# Patient Record
Sex: Male | Born: 1935 | Race: White | Hispanic: No | Marital: Married | Smoking: Former smoker
Health system: Southern US, Community
[De-identification: ages and names within clinical notes are randomized; demographics above are authoritative.]

## PROBLEM LIST (undated history)

## (undated) DIAGNOSIS — K2980 Duodenitis without bleeding: Secondary | ICD-10-CM

## (undated) DIAGNOSIS — K635 Polyp of colon: Secondary | ICD-10-CM

## (undated) DIAGNOSIS — N200 Calculus of kidney: Secondary | ICD-10-CM

## (undated) DIAGNOSIS — M1990 Unspecified osteoarthritis, unspecified site: Secondary | ICD-10-CM

## (undated) DIAGNOSIS — I2510 Atherosclerotic heart disease of native coronary artery without angina pectoris: Secondary | ICD-10-CM

## (undated) DIAGNOSIS — K219 Gastro-esophageal reflux disease without esophagitis: Secondary | ICD-10-CM

## (undated) DIAGNOSIS — N189 Chronic kidney disease, unspecified: Secondary | ICD-10-CM

## (undated) DIAGNOSIS — E785 Hyperlipidemia, unspecified: Secondary | ICD-10-CM

## (undated) DIAGNOSIS — G250 Essential tremor: Secondary | ICD-10-CM

## (undated) DIAGNOSIS — N529 Male erectile dysfunction, unspecified: Secondary | ICD-10-CM

## (undated) DIAGNOSIS — N400 Enlarged prostate without lower urinary tract symptoms: Secondary | ICD-10-CM

## (undated) DIAGNOSIS — I6529 Occlusion and stenosis of unspecified carotid artery: Secondary | ICD-10-CM

## (undated) DIAGNOSIS — M6530 Trigger finger, unspecified finger: Secondary | ICD-10-CM

## (undated) HISTORY — DX: Calculus of kidney: N20.0

## (undated) HISTORY — PX: COLONOSCOPY: SHX174

## (undated) HISTORY — PX: TRIGGER FINGER RELEASE: SHX641

---

## 1996-09-15 HISTORY — PX: CARDIAC CATHETERIZATION: SHX172

## 2006-10-22 ENCOUNTER — Ambulatory Visit: Payer: Self-pay | Admitting: Gastroenterology

## 2009-03-22 ENCOUNTER — Emergency Department: Payer: Self-pay | Admitting: Emergency Medicine

## 2010-01-31 ENCOUNTER — Ambulatory Visit: Payer: Self-pay | Admitting: Gastroenterology

## 2010-08-02 ENCOUNTER — Ambulatory Visit: Payer: Self-pay | Admitting: Anesthesiology

## 2010-08-05 ENCOUNTER — Ambulatory Visit: Payer: Self-pay | Admitting: General Practice

## 2012-06-28 ENCOUNTER — Ambulatory Visit: Payer: Self-pay | Admitting: Anesthesiology

## 2012-07-02 ENCOUNTER — Ambulatory Visit: Payer: Self-pay | Admitting: General Practice

## 2014-07-21 DIAGNOSIS — N183 Chronic kidney disease, stage 3 unspecified: Secondary | ICD-10-CM | POA: Insufficient documentation

## 2014-07-21 DIAGNOSIS — I129 Hypertensive chronic kidney disease with stage 1 through stage 4 chronic kidney disease, or unspecified chronic kidney disease: Secondary | ICD-10-CM | POA: Insufficient documentation

## 2014-09-09 ENCOUNTER — Emergency Department: Payer: Self-pay | Admitting: Emergency Medicine

## 2015-01-02 NOTE — Op Note (Signed)
PATIENT NAME:  Christopher Melton, OZBURN MR#:  [id number] DATE OF BIRTH:  [birth year]  DATE OF PROCEDURE:  07/02/2012  PREOPERATIVE DIAGNOSIS: Stenosing tenosynovitis of the right index, long, and ring fingers.   POSTOPERATIVE DIAGNOSIS:  Stenosing tenosynovitis of the right index, long, and ring fingers.   PROCEDURE PERFORMED: Trigger finger release of the right index, long, and ring fingers.   SURGEON: Laurice Record. Holley Bouche., MD   ANESTHESIA: General.   ESTIMATED BLOOD LOSS: Minimal.   FLUIDS REPLACED: 1800 mL of crystalloid.   TOURNIQUET TIME: 40 minutes.   DRAINS: None.   INDICATIONS FOR SURGERY:  The patient is a 81 year old right-hand dominant male who has been seen for complaints of catching and triggering of the right index, long, and ring fingers. He had associated tenderness in the area of the A1 pulley of the designated fingers. After discussion of the risks and benefits of surgical intervention, the patient expressed his understanding of the risks, benefits, and agreed with plans for surgical intervention.   PROCEDURE IN DETAIL: The patient was brought into the Operating Room, and after adequate general anesthesia was achieved a tourniquet was placed on the patient's upper right arm. The patient's right hand and arm were cleaned and prepped with alcohol and DuraPrep and draped in the usual sterile fashion. A "timeout" was performed as per usual protocol. Loupe magnification was used throughout the procedure. The right upper extremity was exsanguinated using an Esmarch, and the tourniquet was inflated to 250 mmHg. A transverse incision was made along the proximal margin of the A1 pulley of the right ring finger. Blunt dissection was carried down to the tendon sheath, and the proximal edge of the A1 pulley was identified. The A1 pulley was then incised using tenotomy scissors. Complete release of the A1 pulley of the ring finger was performed. Smooth excursion of the flexor tendon was noted.  Next, a transverse incision was made along the proximal margin of the A1 pulley of the right long finger. Blunt dissection was carried down to the tendon sheath, and the proximal edge of the A1 pulley was identified and subsequently released using tenotomy scissors. Again, smooth excursion of the flexor tendon was noted. Finally, a transverse incision was made at the proximal margin of the A1 pulley of the index finger. Blunt dissection was carried down to the tendon sheath, and the proximal margin of the A1 pulley index finger was identified and subsequently released using tenotomy scissors. Smooth excursion of the flexor tendon was noted.   The wounds were irrigated with copious amounts of normal saline with antibiotic solution. Skin edges were reapproximated with interrupted sutures of 5-0 nylon, and 0.25% Marcaine was injected along the incision sites. A sterile dressing was applied. The tourniquet was deflated after a total tourniquet time of 40 minutes.   The patient tolerated the procedure well. He was transported to the recovery room in stable condition.  ____________________________ Laurice Record. Holley Bouche., MD jph:cbb D: 07/03/2012 14:41:01 ET T: 07/03/2012 15:18:44 ET JOB#: [id number]  cc: Jeneen Rinks P. Holley Bouche., MD, <Dictator> JAMES P Holley Bouche MD ELECTRONICALLY SIGNED 07/04/2012 9:02

## 2015-01-19 ENCOUNTER — Other Ambulatory Visit: Payer: Self-pay | Admitting: Internal Medicine

## 2015-01-19 DIAGNOSIS — R1310 Dysphagia, unspecified: Secondary | ICD-10-CM

## 2015-01-22 ENCOUNTER — Ambulatory Visit: Payer: Self-pay

## 2015-01-24 ENCOUNTER — Ambulatory Visit
Admission: RE | Admit: 2015-01-24 | Discharge: 2015-01-24 | Disposition: A | Discharge status: Home / Self Care | Payer: Medicare Other | Source: Ambulatory Visit | Attending: Internal Medicine | Admitting: Internal Medicine

## 2015-01-24 DIAGNOSIS — K2210 Ulcer of esophagus without bleeding: Secondary | ICD-10-CM | POA: Insufficient documentation

## 2015-01-24 DIAGNOSIS — R1310 Dysphagia, unspecified: Secondary | ICD-10-CM

## 2015-01-24 DIAGNOSIS — K209 Esophagitis, unspecified: Secondary | ICD-10-CM | POA: Insufficient documentation

## 2015-01-24 DIAGNOSIS — K449 Diaphragmatic hernia without obstruction or gangrene: Secondary | ICD-10-CM | POA: Insufficient documentation

## 2015-06-18 ENCOUNTER — Encounter: Payer: Self-pay | Admitting: *Deleted

## 2015-06-19 ENCOUNTER — Ambulatory Visit: Payer: Medicare Other | Admitting: Anesthesiology

## 2015-06-19 ENCOUNTER — Encounter: Payer: Self-pay | Admitting: Anesthesiology

## 2015-06-19 ENCOUNTER — Encounter
Admission: RE | Disposition: A | Discharge status: Home / Self Care | Payer: Self-pay | Source: Ambulatory Visit | Attending: Gastroenterology

## 2015-06-19 ENCOUNTER — Ambulatory Visit
Admission: RE | Admit: 2015-06-19 | Discharge: 2015-06-19 | Disposition: A | Discharge status: Home / Self Care | Payer: Medicare Other | Source: Ambulatory Visit | Attending: Gastroenterology | Admitting: Gastroenterology

## 2015-06-19 DIAGNOSIS — Z820 Family history of epilepsy and other diseases of the nervous system: Secondary | ICD-10-CM | POA: Insufficient documentation

## 2015-06-19 DIAGNOSIS — M1990 Unspecified osteoarthritis, unspecified site: Secondary | ICD-10-CM | POA: Insufficient documentation

## 2015-06-19 DIAGNOSIS — N400 Enlarged prostate without lower urinary tract symptoms: Secondary | ICD-10-CM | POA: Insufficient documentation

## 2015-06-19 DIAGNOSIS — K2210 Ulcer of esophagus without bleeding: Secondary | ICD-10-CM | POA: Diagnosis not present

## 2015-06-19 DIAGNOSIS — I6529 Occlusion and stenosis of unspecified carotid artery: Secondary | ICD-10-CM | POA: Insufficient documentation

## 2015-06-19 DIAGNOSIS — E785 Hyperlipidemia, unspecified: Secondary | ICD-10-CM | POA: Diagnosis not present

## 2015-06-19 DIAGNOSIS — Z79899 Other long term (current) drug therapy: Secondary | ICD-10-CM | POA: Diagnosis not present

## 2015-06-19 DIAGNOSIS — K2980 Duodenitis without bleeding: Secondary | ICD-10-CM | POA: Insufficient documentation

## 2015-06-19 DIAGNOSIS — Z833 Family history of diabetes mellitus: Secondary | ICD-10-CM | POA: Diagnosis not present

## 2015-06-19 DIAGNOSIS — K5730 Diverticulosis of large intestine without perforation or abscess without bleeding: Secondary | ICD-10-CM | POA: Diagnosis not present

## 2015-06-19 DIAGNOSIS — G250 Essential tremor: Secondary | ICD-10-CM | POA: Diagnosis not present

## 2015-06-19 DIAGNOSIS — Z87891 Personal history of nicotine dependence: Secondary | ICD-10-CM | POA: Insufficient documentation

## 2015-06-19 DIAGNOSIS — I129 Hypertensive chronic kidney disease with stage 1 through stage 4 chronic kidney disease, or unspecified chronic kidney disease: Secondary | ICD-10-CM | POA: Insufficient documentation

## 2015-06-19 DIAGNOSIS — K449 Diaphragmatic hernia without obstruction or gangrene: Secondary | ICD-10-CM | POA: Insufficient documentation

## 2015-06-19 DIAGNOSIS — Z888 Allergy status to other drugs, medicaments and biological substances status: Secondary | ICD-10-CM | POA: Diagnosis not present

## 2015-06-19 DIAGNOSIS — K2950 Unspecified chronic gastritis without bleeding: Secondary | ICD-10-CM | POA: Insufficient documentation

## 2015-06-19 DIAGNOSIS — Z7982 Long term (current) use of aspirin: Secondary | ICD-10-CM | POA: Insufficient documentation

## 2015-06-19 DIAGNOSIS — Z86010 Personal history of colonic polyps: Secondary | ICD-10-CM | POA: Diagnosis not present

## 2015-06-19 DIAGNOSIS — N183 Chronic kidney disease, stage 3 (moderate): Secondary | ICD-10-CM | POA: Insufficient documentation

## 2015-06-19 DIAGNOSIS — Z808 Family history of malignant neoplasm of other organs or systems: Secondary | ICD-10-CM | POA: Insufficient documentation

## 2015-06-19 DIAGNOSIS — R1310 Dysphagia, unspecified: Secondary | ICD-10-CM | POA: Insufficient documentation

## 2015-06-19 DIAGNOSIS — D127 Benign neoplasm of rectosigmoid junction: Secondary | ICD-10-CM | POA: Insufficient documentation

## 2015-06-19 HISTORY — DX: Polyp of colon: K63.5

## 2015-06-19 HISTORY — DX: Trigger finger, unspecified finger: M65.30

## 2015-06-19 HISTORY — DX: Male erectile dysfunction, unspecified: N52.9

## 2015-06-19 HISTORY — DX: Benign prostatic hyperplasia without lower urinary tract symptoms: N40.0

## 2015-06-19 HISTORY — DX: Hyperlipidemia, unspecified: E78.5

## 2015-06-19 HISTORY — PX: COLONOSCOPY WITH PROPOFOL: SHX5780

## 2015-06-19 HISTORY — PX: ESOPHAGOGASTRODUODENOSCOPY (EGD) WITH PROPOFOL: SHX5813

## 2015-06-19 HISTORY — DX: Occlusion and stenosis of unspecified carotid artery: I65.29

## 2015-06-19 HISTORY — DX: Essential tremor: G25.0

## 2015-06-19 HISTORY — DX: Unspecified osteoarthritis, unspecified site: M19.90

## 2015-06-19 SURGERY — COLONOSCOPY WITH PROPOFOL
Anesthesia: General

## 2015-06-19 MED ORDER — FENTANYL CITRATE (PF) 100 MCG/2ML IJ SOLN
INTRAMUSCULAR | Status: DC | PRN
Start: 2015-06-19 — End: 2015-06-19
  Administered 2015-06-19 (×4): 25 ug via INTRAVENOUS

## 2015-06-19 MED ORDER — PHENYLEPHRINE HCL 10 MG/ML IJ SOLN
INTRAMUSCULAR | Status: DC | PRN
  Administered 2015-06-19: 50 ug via INTRAVENOUS

## 2015-06-19 MED ORDER — MIDAZOLAM HCL 2 MG/2ML IJ SOLN
INTRAMUSCULAR | Status: DC | PRN
  Administered 2015-06-19 (×4): 0.5 mg via INTRAVENOUS

## 2015-06-19 MED ORDER — PROPOFOL 500 MG/50ML IV EMUL
INTRAVENOUS | Status: DC | PRN
  Administered 2015-06-19: 75 ug/kg/min via INTRAVENOUS

## 2015-06-19 MED ORDER — SODIUM CHLORIDE 0.9 % IV SOLN: INTRAVENOUS | Status: DC

## 2015-06-19 MED ORDER — SODIUM CHLORIDE 0.9 % IV SOLN
INTRAVENOUS | Status: DC
  Administered 2015-06-19: 09:00:00 via INTRAVENOUS

## 2015-06-19 NOTE — Anesthesia Preprocedure Evaluation (Signed)
Anesthesia Evaluation  Patient identified by MRN, date of birth, ID band Patient awake    Reviewed: Allergy & Precautions, H&P , NPO status , Patient's Chart, lab work & pertinent test results, reviewed documented beta blocker date and time   History of Anesthesia Complications Negative for: history of anesthetic complications  Airway Mallampati: IV  TM Distance: >3 FB Neck ROM: full    Dental no notable dental hx. (+) Caps, Teeth Intact   Pulmonary neg pulmonary ROS,    Pulmonary exam normal breath sounds clear to auscultation       Cardiovascular Exercise Tolerance: Good hypertension, On Medications (-) angina+ Peripheral Vascular Disease (Carotid stenosis)  (-) CAD, (-) Past MI, (-) Cardiac Stents and (-) CABG Normal cardiovascular exam(-) dysrhythmias (-) Valvular Problems/Murmurs Rhythm:regular Rate:Normal     Neuro/Psych negative neurological ROS  negative psych ROS   GI/Hepatic negative GI ROS, Neg liver ROS,   Endo/Other  negative endocrine ROS  Renal/GU negative Renal ROS  negative genitourinary   Musculoskeletal   Abdominal   Peds  Hematology negative hematology ROS (+)   Anesthesia Other Findings Past Medical History:   Hyperlipidemia                                               Erectile dysfunction                                         Osteoarthritis                                               Colon polyps                                                 Familial tremor                                              Carotid stenosis                                             Trigger finger                                               BPH (benign prostatic hyperplasia)                           Reproductive/Obstetrics negative OB ROS                             Anesthesia Physical Anesthesia Plan  ASA: II  Anesthesia Plan: General   Post-op Pain  Management:     Induction:   Airway Management Planned:   Additional Equipment:   Intra-op Plan:   Post-operative Plan:   Informed Consent: I have reviewed the patients History and Physical, chart, labs and discussed the procedure including the risks, benefits and alternatives for the proposed anesthesia with the patient or authorized representative who has indicated his/her understanding and acceptance.   Dental Advisory Given  Plan Discussed with: Anesthesiologist, CRNA and Surgeon  Anesthesia Plan Comments:         Anesthesia Quick Evaluation

## 2015-06-19 NOTE — Transfer of Care (Signed)
Immediate Anesthesia Transfer of Care Note  Patient: Christopher Melton  Procedure(s) Performed: Procedure(s): COLONOSCOPY WITH PROPOFOL (N/A) ESOPHAGOGASTRODUODENOSCOPY (EGD) WITH PROPOFOL (N/A)  Patient Location: PACU  Anesthesia Type:General  Level of Consciousness: sedated  Airway & Oxygen Therapy: Patient Spontanous Breathing and Patient connected to nasal cannula oxygen  Post-op Assessment: Report given to RN and Post -op Vital signs reviewed and unstable, Anesthesiologist notified  Post vital signs: Reviewed and stable  Last Vitals:  Filed Vitals:   06/19/15 0819  BP: 163/70  Pulse: 72  Temp: 35.8 C  Resp: 17     Complications: No apparent anesthesia complications

## 2015-06-19 NOTE — Op Note (Signed)
[facility] Gastroenterology Patient Name: Christopher Melton Procedure Date: 06/19/2015 9:04 AM MRN: [id number] Account #: [id number] Date of Birth: Jul 28, 1934 Admit Type: Outpatient Age: 81 Room: [facility] ENDO ROOM 3 Gender: Male Note Status: Finalized Procedure:         Upper GI endoscopy Indications:       Dysphagia Providers:         Lollie Sails, MD Referring MD:      Ocie Cornfield. Ouida Sills, MD (Referring MD) Medicines:         Monitored Anesthesia Care Complications:     No immediate complications. Procedure:         Pre-Anesthesia Assessment:                    - ASA Grade Assessment: II - A patient with mild systemic                     disease.                    After obtaining informed consent, the endoscope was passed                     under direct vision. Throughout the procedure, the                     patient's blood pressure, pulse, and oxygen saturations                     were monitored continuously. The Endoscope was introduced                     through the mouth, and advanced to the third part of                     duodenum. The upper GI endoscopy was accomplished without                     difficulty. The patient tolerated the procedure well. Findings:      Abnormal motility was noted in the middle third of the esophagus and in       the lower third of the esophagus. The cricopharyngeus was normal. There       are extra peristaltic waves of the esophageal body. The distal       esophagus/lower esophageal sphincter is open. Tertiary peristaltic waves       are noted.      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 normal esophageal caliber. no mucosal defect seen      LA Grade B (one or more mucosal breaks greater than 5 mm, not extending       between the tops of two mucosal folds) esophagitis with no bleeding was       found. Biopsies were taken with a cold forceps for histology.      A small  hiatus hernia was present.      The entire examined stomach was normal.      The cardia and gastric fundus were normal on retroflexion.      Patchy mild inflammation characterized by congestion (edema) and       erythema was found in the duodenal bulb and in the second part of the       duodenum.      Localized nodular mucosa was found  in the anterior duodenal bulb.       Biopsies were taken with a cold forceps for histology.      The exam of the duodenum was otherwise normal. 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                    - LA Grade B erosive esophagitis. Biopsied.                    - Small hiatus hernia.                    - Normal stomach.                    - Erosive duodenitis.                    - Nodular mucosa in the duodenal bulb. Biopsied. Recommendation:    - Use Aciphex (rabeprazole) 20 mg PO daily daily.                    - Return to GI clinic in 1 month. Procedure Code(s): --- Professional ---                    [id number], Esophagogastroduodenoscopy, flexible, transoral;                     with biopsy, single or multiple Diagnosis Code(s): --- Professional ---                    530.19, Other esophagitis                    535.60, Duodenitis, without mention of hemorrhage                    537.9, Unspecified disorder of stomach and duodenum                    787.20, Dysphagia, unspecified                    553.3, Diaphragmatic hernia without mention of obstruction                     or gangrene CPT copyright 2014 American Medical Association. All rights reserved. The codes documented in this report are preliminary and upon coder review may  be revised to meet current compliance requirements. Lollie Sails, MD 06/19/2015 9:30:43 AM This report has been signed electronically. Number of Addenda: 0 Note Initiated On: 06/19/2015 9:04 AM      [facility]

## 2015-06-19 NOTE — H&P (Signed)
Outpatient short stay form Pre-procedure 06/19/2015 8:58 AM Christopher Sails MD  Primary Physician: Dr. Frazier Richards  Reason for visit:  EGD and colonoscopy  History of present illness:  Patient is a 81 year old male presenting for EGD and colonoscopy. He is been having some difficulty with cervical dysphagia. On a barium swallow there is a possibility of ulceration or other abnormality noted at the cervicothoracic junction without a associated mass. There is also a small pseudodiverticulum at that spot. Believe this is likely causing a slight catch there. He also was  shown to have fairly extensive esophageal dysmotility. He had been taking medications immediately before going to bed.    Current facility-administered medications:  .  0.9 %  sodium chloride infusion, , Intravenous, Continuous, Christopher Sails, MD, Last Rate: 20 mL/hr at 06/19/15 0834 .  0.9 %  sodium chloride infusion, , Intravenous, Continuous, Christopher Sails, MD  Prescriptions prior to admission  Medication Sig Dispense Refill Last Dose  . aspirin 81 MG tablet Take 81 mg by mouth daily.   06/17/2015 at 0930  . losartan-hydrochlorothiazide (HYZAAR) 100-12.5 MG tablet Take 1 tablet by mouth daily.   06/18/2015 at 0900     Allergies  Allergen Reactions  . Ace Inhibitors   . Lovastatin      Past Medical History  Diagnosis Date  . Hyperlipidemia   . Erectile dysfunction   . Osteoarthritis   . Colon polyps   . Familial tremor   . Carotid stenosis   . Trigger finger   . BPH (benign prostatic hyperplasia)     Review of systems:      Physical Exam    Heart and lungs: Regular rate and rhythm without rub or gallop, lungs are bilaterally clear    HEENT: Normocephalic atraumatic eyes are anicteric    Other:     Pertinant exam for procedure: Soft and slightly protuberant nontender bowel sounds are positive normoactive    Planned proceedures: EGD and colonoscopy with indicated procedures I have  discussed the risks benefits and complications of procedures to include not limited to bleeding, infection, perforation and the risk of sedation and the patient wishes to proceed.  I did discuss with patient's and his wife that he needs to low down a bit when eating and alternate liquids and solids. Needs to chew well.    Christopher Sails, MD Gastroenterology 06/19/2015  8:58 AM

## 2015-06-19 NOTE — Op Note (Signed)
[facility] Gastroenterology Patient Name: Christopher Melton Procedure Date: 06/19/2015 9:04 AM MRN: [id number] Account #: [id number] Date of Birth: 04/22/1934 Admit Type: Outpatient Age: 81 Room: [facility] ENDO ROOM 3 Gender: Male Note Status: Finalized Procedure:         Colonoscopy Indications:       Personal history of colonic polyps Providers:         Lollie Sails, MD Referring MD:      Ocie Cornfield. Ouida Sills, MD (Referring MD) Medicines:         Monitored Anesthesia Care Complications:     No immediate complications. Procedure:         Pre-Anesthesia Assessment:                    - ASA Grade Assessment: II - A patient with mild systemic                     disease.                    After obtaining informed consent, the colonoscope was                     passed under direct vision. Throughout the procedure, the                     patient's blood pressure, pulse, and oxygen saturations                     were monitored continuously. The Colonoscope was                     introduced through the anus and advanced to the the cecum,                     identified by appendiceal orifice and ileocecal valve. The                     colonoscopy was performed with moderate difficulty due to                     a tortuous colon. Successful completion of the procedure                     was aided by changing the patient to a supine position and                     using manual pressure. The quality of the bowel                     preparation was good. Findings:      A 5 mm polyp was found in the recto-sigmoid colon. The polyp was       sessile. The polyp was removed with a cold biopsy forceps. Resection and       retrieval were complete.      A few small-mouthed diverticula were found in the sigmoid colon.      The retroflexed view of the distal rectum and anal verge was normal and       showed no anal or rectal abnormalities.      The digital rectal exam was  normal. Impression:        - One 5 mm polyp at the recto-sigmoid colon. Resected and  retrieved.                    - Diverticulosis in the sigmoid colon. Recommendation:    - Await pathology results.                    - Telephone GI clinic for pathology results in 1 week. Procedure Code(s): --- Professional ---                    [phone], Colonoscopy, flexible; with biopsy, single or                     multiple Diagnosis Code(s): --- Professional ---                    211.4, Benign neoplasm of rectum and anal canal                    V12.72, Personal history of colonic polyps                    562.10, Diverticulosis of colon (without mention of                     hemorrhage) CPT copyright 2014 American Medical Association. All rights reserved. The codes documented in this report are preliminary and upon coder review may  be revised to meet current compliance requirements. Lollie Sails, MD 06/19/2015 9:55:54 AM This report has been signed electronically. Number of Addenda: 0 Note Initiated On: 06/19/2015 9:04 AM Scope Withdrawal Time: 0 hours 5 minutes 43 seconds  Total Procedure Duration: 0 hours 19 minutes 17 seconds       [facility]

## 2015-06-20 LAB — SURGICAL PATHOLOGY

## 2015-06-21 NOTE — Anesthesia Postprocedure Evaluation (Signed)
  Anesthesia Post-op Note  Patient: Christopher Melton  Procedure(s) Performed: Procedure(s): COLONOSCOPY WITH PROPOFOL (N/A) ESOPHAGOGASTRODUODENOSCOPY (EGD) WITH PROPOFOL (N/A)  Anesthesia type:General  Patient location: PACU  Post pain: Pain level controlled  Post assessment: Post-op Vital signs reviewed, Patient's Cardiovascular Status Stable, Respiratory Function Stable, Patent Airway and No signs of Nausea or vomiting  Post vital signs: Reviewed and stable  Last Vitals:  Filed Vitals:   06/19/15 1040  BP: 136/69  Pulse: 62  Temp:   Resp: 16    Level of consciousness: awake, alert  and patient cooperative  Complications: No apparent anesthesia complications

## 2015-06-25 ENCOUNTER — Encounter: Payer: Self-pay | Admitting: Gastroenterology

## 2015-11-20 DIAGNOSIS — Z0000 Encounter for general adult medical examination without abnormal findings: Secondary | ICD-10-CM | POA: Insufficient documentation

## 2016-12-30 ENCOUNTER — Ambulatory Visit (INDEPENDENT_AMBULATORY_CARE_PROVIDER_SITE_OTHER): Payer: Medicare Other | Admitting: Vascular Surgery

## 2016-12-30 ENCOUNTER — Encounter (INDEPENDENT_AMBULATORY_CARE_PROVIDER_SITE_OTHER): Payer: Self-pay | Admitting: Vascular Surgery

## 2016-12-30 VITALS — BP 167/79 | HR 64 | Resp 16 | Ht 70.0 in | Wt 196.0 lb

## 2016-12-30 DIAGNOSIS — M79605 Pain in left leg: Secondary | ICD-10-CM | POA: Diagnosis not present

## 2016-12-30 DIAGNOSIS — I10 Essential (primary) hypertension: Secondary | ICD-10-CM

## 2016-12-30 DIAGNOSIS — M79609 Pain in unspecified limb: Secondary | ICD-10-CM | POA: Insufficient documentation

## 2016-12-30 NOTE — Assessment & Plan Note (Signed)
blood pressure control important in reducing the progression of atherosclerotic disease. On appropriate oral medications.  

## 2016-12-30 NOTE — Progress Notes (Signed)
MRN : [id number]  Christopher Melton is a 83 y.o. (1934-04-20) male who presents with chief complaint of  Chief Complaint  Patient presents with  . Re-evaluation    Left leg pain  .  History of Present Illness: Patient returns today in follow up of left leg pain.  He was previously evaluated over 2 years ago which time he was found to have a negative venous studies. His pain has been largely in the left calf and lower leg. This is not reliably associated with activity. There is intermittent swelling although he thinks his left leg has been getting smaller over the past several months and actually having atrophy. They look reasonably symmetric today. He does not have ulceration or infection. Nothing really seems to make this better other than time. He does have a history of low back problems as well as a previous Baker cyst on the left knee a couple of years ago. The right leg is not really that bothersome. He denies chest pain or shortness of breath.  Current Outpatient Prescriptions  Medication Sig Dispense Refill  . aspirin 81 MG tablet Take 81 mg by mouth daily.    Marland Kitchen losartan-hydrochlorothiazide (HYZAAR) 100-12.5 MG tablet Take 1 tablet by mouth daily.    . pantoprazole (PROTONIX) 40 MG tablet     . sildenafil (REVATIO) 20 MG tablet Take by mouth.     No current facility-administered medications for this visit.     Past Medical History:  Diagnosis Date  . BPH (benign prostatic hyperplasia)   . Carotid stenosis   . Colon polyps   . Erectile dysfunction   . Familial tremor   . Hyperlipidemia   . Osteoarthritis   . Trigger finger     Past Surgical History:  Procedure Laterality Date  . COLONOSCOPY    . COLONOSCOPY WITH PROPOFOL N/A 06/19/2015   Procedure: COLONOSCOPY WITH PROPOFOL;  Surgeon: Lollie Sails, MD;  Location: [facility] ENDOSCOPY;  Service: Endoscopy;  Laterality: N/A;  . ESOPHAGOGASTRODUODENOSCOPY (EGD) WITH PROPOFOL N/A 06/19/2015   Procedure:  ESOPHAGOGASTRODUODENOSCOPY (EGD) WITH PROPOFOL;  Surgeon: Lollie Sails, MD;  Location: [facility] ENDOSCOPY;  Service: Endoscopy;  Laterality: N/A;    Social History Social History  Substance Use Topics  . Smoking status: Former Smoker    Quit date: 12/30/1976  . Smokeless tobacco: Never Used  . Alcohol use No  No IVDU  Family History No bleeding disorders, clotting disorders, aneurysms, or autoimmune diseases  Allergies  Allergen Reactions  . Ace Inhibitors   . Lovastatin      REVIEW OF SYSTEMS (Negative unless checked)  Constitutional: [] Weight loss  [] Fever  [] Chills Cardiac: [] Chest pain   [] Chest pressure   [] Palpitations   [] Shortness of breath when laying flat   [] Shortness of breath at rest   [x] Shortness of breath with exertion. Vascular:  [x] Pain in legs with walking   [x] Pain in legs at rest   [] Pain in legs when laying flat   [] Claudication   [] Pain in feet when walking  [] Pain in feet at rest  [] Pain in feet when laying flat   [] History of DVT   [] Phlebitis   [x] Swelling in legs   [] Varicose veins   [] Non-healing ulcers Pulmonary:   [] Uses home oxygen   [] Productive cough   [] Hemoptysis   [] Wheeze  [] COPD   [] Asthma Neurologic:  [] Dizziness  [] Blackouts   [] Seizures   [] History of stroke   [] History of TIA  [] Aphasia   [] Temporary blindness   []   Dysphagia   [] Weakness or numbness in arms   [] Weakness or numbness in legs Musculoskeletal:  [x] Arthritis   [] Joint swelling   [] Joint pain   [x] Low back pain Hematologic:  [] Easy bruising  [] Easy bleeding   [] Hypercoagulable state   [] Anemic   Gastrointestinal:  [] Blood in stool   [] Vomiting blood  [] Gastroesophageal reflux/heartburn   [] Abdominal pain Genitourinary:  [] Chronic kidney disease   [] Difficult urination  [] Frequent urination  [] Burning with urination   [] Hematuria Skin:  [] Rashes   [] Ulcers   [] Wounds Psychological:  [] History of anxiety   []  History of major depression.  Physical Examination  BP (!) 167/79 (BP  Location: Right Arm)   Pulse 64   Resp 16   Ht 5\' 10"  (1.778 m)   Wt 196 lb (88.9 kg)   BMI 28.12 kg/m  Gen:  WD/WN, NAD Head: [place]/AT, No temporalis wasting. Ear/Nose/Throat: Hearing grossly intact, nares w/o erythema or drainage, trachea midline Eyes: Conjunctiva clear. Sclera non-icteric Neck: Supple.  No JVD.  Pulmonary:  Good air movement, no use of accessory muscles.  Cardiac: RRR, normal S1, S2 Vascular:  Vessel Right Left  Radial Palpable Palpable  Ulnar Palpable Palpable  Brachial Palpable Palpable  Carotid Palpable, without bruit Palpable, without bruit  Aorta Not palpable N/A  Femoral Palpable Palpable  Popliteal Palpable Palpable  PT 1+ Palpable 1+ Palpable  DP Palpable Palpable   Gastrointestinal: soft, non-tender/non-distended. No guarding/reflex.  Musculoskeletal: M/S 5/5 throughout.  No deformity or atrophy. Trace LE edema. Neurologic: Sensation grossly intact in extremities.  Symmetrical.  Speech is fluent.  Psychiatric: Judgment intact, Mood & affect appropriate for pt's clinical situation. Dermatologic: No rashes or ulcers noted.  No cellulitis or open wounds. Lymph : No Cervical, Axillary, or Inguinal lymphadenopathy.      Labs No results found for this or any previous visit (from the past 2160 hour(s)).  Radiology No results found.    Assessment/Plan  Essential hypertension blood pressure control important in reducing the progression of atherosclerotic disease. On appropriate oral medications.   Pain in limb  Recommend:  The patient has atypical pain symptoms for pure atherosclerotic disease. However, on physical exam there is evidence of mixed venous and arterial disease, given the diminished pulses and the edema associated with venous changes of the legs.  Noninvasive studies including ABI's and venous ultrasound of the legs will be obtained and the patient will follow up with me to review these studies.  The patient should continue  walking and begin a more formal exercise program. The patient should continue his antiplatelet therapy and aggressive treatment of the lipid abnormalities.  The patient should begin wearing graduated compression socks 15-20 mmHg strength to control edema.     Leotis Pain, MD  12/30/2016 12:41 PM    This note was created with Dragon medical transcription system.  Any errors from dictation are purely unintentional

## 2016-12-30 NOTE — Assessment & Plan Note (Signed)

## 2016-12-30 NOTE — Patient Instructions (Signed)
Peripheral Vascular Disease Peripheral vascular disease (PVD) is a disease of the blood vessels that are not part of your heart and brain. A simple term for PVD is poor circulation. In most cases, PVD narrows the blood vessels that carry blood from your heart to the rest of your body. This can result in a decreased supply of blood to your arms, legs, and internal organs, like your stomach or kidneys. However, it most often affects a person's lower legs and feet. There are two types of PVD.  Organic PVD. This is the more common type. It is caused by damage to the structure of blood vessels.  Functional PVD. This is caused by conditions that make blood vessels contract and tighten (spasm).  Without treatment, PVD tends to get worse over time. PVD can also lead to acute ischemic limb. This is when an arm or limb suddenly has trouble getting enough blood. This is a medical emergency. What are the causes? Each type of PVD has many different causes. The most common cause of PVD is buildup of a fatty material (plaque) inside of your arteries (atherosclerosis). Small amounts of plaque can break off from the walls of the blood vessels and become lodged in a smaller artery. This blocks blood flow and can cause acute ischemic limb. Other common causes of PVD include:  Blood clots that form inside of blood vessels.  Injuries to blood vessels.  Diseases that cause inflammation of blood vessels or cause blood vessel spasms.  Health behaviors and health history that increase your risk of developing PVD.  What increases the risk? You may have a greater risk of PVD if you:  Have a family history of PVD.  Have certain medical conditions, including: ? High cholesterol. ? Diabetes. ? High blood pressure (hypertension). ? Coronary heart disease. ? Past problems with blood clots. ? Past injury, such as burns or a broken bone. These may have damaged blood vessels in your limbs. ? Buerger disease. This is  caused by inflamed blood vessels in your hands and feet. ? Some forms of arthritis. ? Rare birth defects that affect the arteries in your legs.  Use tobacco.  Do not get enough exercise.  Are obese.  Are age 50 or older.  What are the signs or symptoms? PVD may cause many different symptoms. Your symptoms depend on what part of your body is not getting enough blood. Some common signs and symptoms include:  Cramps in your lower legs. This may be a symptom of poor leg circulation (claudication).  Pain and weakness in your legs while you are physically active that goes away when you rest (intermittent claudication).  Leg pain when at rest.  Leg numbness, tingling, or weakness.  Coldness in a leg or foot, especially when compared with the other leg.  Skin or hair changes. These can include: ? Hair loss. ? Shiny skin. ? Pale or bluish skin. ? Thick toenails.  Inability to get or maintain an erection (erectile dysfunction).  People with PVD are more prone to developing ulcers and sores on their toes, feet, or legs. These may take longer than normal to heal. How is this diagnosed? Your health care provider may diagnose PVD from your signs and symptoms. The health care provider will also do a physical exam. You may have tests to find out what is causing your PVD and determine its severity. Tests may include:  Blood pressure recordings from your arms and legs and measurements of the strength of your pulses (  pulse volume recordings).  Imaging studies using sound waves to take pictures of the blood flow through your blood vessels (Doppler ultrasound).  Injecting a dye into your blood vessels before having imaging studies using: ? X-rays (angiogram or arteriogram). ? Computer-generated X-rays (CT angiogram). ? A powerful electromagnetic field and a computer (magnetic resonance angiogram or MRA).  How is this treated? Treatment for PVD depends on the cause of your condition and the  severity of your symptoms. It also depends on your age. Underlying causes need to be treated and controlled. These include long-lasting (chronic) conditions, such as diabetes, high cholesterol, and high blood pressure. You may need to first try making lifestyle changes and taking medicines. Surgery may be needed if these do not work. Lifestyle changes may include:  Quitting smoking.  Exercising regularly.  Following a low-fat, low-cholesterol diet.  Medicines may include:  Blood thinners to prevent blood clots.  Medicines to improve blood flow.  Medicines to improve your blood cholesterol levels.  Surgical procedures may include:  A procedure that uses an inflated balloon to open a blocked artery and improve blood flow (angioplasty).  A procedure to put in a tube (stent) to keep a blocked artery open (stent implant).  Surgery to reroute blood flow around a blocked artery (peripheral bypass surgery).  Surgery to remove dead tissue from an infected wound on the affected limb.  Amputation. This is surgical removal of the affected limb. This may be necessary in cases of acute ischemic limb that are not improved through medical or surgical treatments.  Follow these instructions at home:  Take medicines only as directed by your health care provider.  Do not use any tobacco products, including cigarettes, chewing tobacco, or electronic cigarettes. If you need help quitting, ask your health care provider.  Lose weight if you are overweight, and maintain a healthy weight as directed by your health care provider.  Eat a diet that is low in fat and cholesterol. If you need help, ask your health care provider.  Exercise regularly. Ask your health care provider to suggest some good activities for you.  Use compression stockings or other mechanical devices as directed by your health care provider.  Take good care of your feet. ? Wear comfortable shoes that fit well. ? Check your feet  often for any cuts or sores. Contact a health care provider if:  You have cramps in your legs while walking.  You have leg pain when you are at rest.  You have coldness in a leg or foot.  Your skin changes.  You have erectile dysfunction.  You have cuts or sores on your feet that are not healing. Get help right away if:  Your arm or leg turns cold and blue.  Your arms or legs become red, warm, swollen, painful, or numb.  You have chest pain or trouble breathing.  You suddenly have weakness in your face, arm, or leg.  You become very confused or lose the ability to speak.  You suddenly have a very bad headache or lose your vision. This information is not intended to replace advice given to you by your health care provider. Make sure you discuss any questions you have with your health care provider. Document Released: 10/09/2004 Document Revised: 02/07/2016 Document Reviewed: 02/09/2014 Elsevier Interactive Patient Education  2017 [employer]  

## 2017-03-16 ENCOUNTER — Encounter (INDEPENDENT_AMBULATORY_CARE_PROVIDER_SITE_OTHER): Payer: Medicare Other

## 2017-03-16 ENCOUNTER — Ambulatory Visit (INDEPENDENT_AMBULATORY_CARE_PROVIDER_SITE_OTHER): Payer: Medicare Other | Admitting: Vascular Surgery

## 2017-05-27 ENCOUNTER — Ambulatory Visit (INDEPENDENT_AMBULATORY_CARE_PROVIDER_SITE_OTHER): Payer: Medicare Other

## 2017-05-27 DIAGNOSIS — M79605 Pain in left leg: Secondary | ICD-10-CM | POA: Diagnosis not present

## 2017-05-29 ENCOUNTER — Encounter (INDEPENDENT_AMBULATORY_CARE_PROVIDER_SITE_OTHER): Payer: Medicare Other

## 2017-05-29 ENCOUNTER — Ambulatory Visit (INDEPENDENT_AMBULATORY_CARE_PROVIDER_SITE_OTHER): Payer: Medicare Other | Admitting: Vascular Surgery

## 2018-05-11 ENCOUNTER — Other Ambulatory Visit: Payer: Self-pay

## 2018-05-11 ENCOUNTER — Encounter: Payer: Self-pay | Admitting: *Deleted

## 2018-05-12 NOTE — Discharge Instructions (Signed)

## 2018-05-19 ENCOUNTER — Ambulatory Visit: Payer: Medicare Other | Admitting: Anesthesiology

## 2018-05-19 ENCOUNTER — Encounter
Admission: RE | Disposition: A | Discharge status: Home / Self Care | Payer: Self-pay | Source: Ambulatory Visit | Attending: Ophthalmology

## 2018-05-19 ENCOUNTER — Ambulatory Visit
Admission: RE | Admit: 2018-05-19 | Discharge: 2018-05-19 | Disposition: A | Discharge status: Home / Self Care | Payer: Medicare Other | Source: Ambulatory Visit | Attending: Ophthalmology | Admitting: Ophthalmology

## 2018-05-19 DIAGNOSIS — M1990 Unspecified osteoarthritis, unspecified site: Secondary | ICD-10-CM | POA: Insufficient documentation

## 2018-05-19 DIAGNOSIS — Z79899 Other long term (current) drug therapy: Secondary | ICD-10-CM | POA: Insufficient documentation

## 2018-05-19 DIAGNOSIS — H2512 Age-related nuclear cataract, left eye: Secondary | ICD-10-CM | POA: Diagnosis present

## 2018-05-19 DIAGNOSIS — Z885 Allergy status to narcotic agent status: Secondary | ICD-10-CM | POA: Insufficient documentation

## 2018-05-19 DIAGNOSIS — Z7982 Long term (current) use of aspirin: Secondary | ICD-10-CM | POA: Diagnosis not present

## 2018-05-19 DIAGNOSIS — K219 Gastro-esophageal reflux disease without esophagitis: Secondary | ICD-10-CM | POA: Diagnosis not present

## 2018-05-19 DIAGNOSIS — I10 Essential (primary) hypertension: Secondary | ICD-10-CM | POA: Insufficient documentation

## 2018-05-19 DIAGNOSIS — Z87891 Personal history of nicotine dependence: Secondary | ICD-10-CM | POA: Insufficient documentation

## 2018-05-19 HISTORY — DX: Gastro-esophageal reflux disease without esophagitis: K21.9

## 2018-05-19 HISTORY — PX: CATARACT EXTRACTION W/PHACO: SHX586

## 2018-05-19 SURGERY — PHACOEMULSIFICATION, CATARACT, WITH IOL INSERTION
Anesthesia: Monitor Anesthesia Care | Site: Eye | Laterality: Left | Wound class: "Clean "

## 2018-05-19 MED ORDER — CEFUROXIME OPHTHALMIC INJECTION 1 MG/0.1 ML
INJECTION | OPHTHALMIC | Status: DC | PRN
  Administered 2018-05-19: 0.1 mL via OPHTHALMIC

## 2018-05-19 MED ORDER — MOXIFLOXACIN HCL 0.5 % OP SOLN
1.0000 [drp] | OPHTHALMIC | Status: DC | PRN
  Administered 2018-05-19 (×3): 1 [drp] via OPHTHALMIC

## 2018-05-19 MED ORDER — ACETAMINOPHEN 160 MG/5ML PO SOLN: 325.0000 mg | ORAL | Status: DC | PRN

## 2018-05-19 MED ORDER — EPINEPHRINE PF 1 MG/ML IJ SOLN
INTRAOCULAR | Status: DC | PRN
  Administered 2018-05-19: 57 mL via OPHTHALMIC

## 2018-05-19 MED ORDER — NA HYALUR & NA CHOND-NA HYALUR 0.4-0.35 ML IO KIT
PACK | INTRAOCULAR | Status: DC | PRN
  Administered 2018-05-19: 1 mL via INTRAOCULAR

## 2018-05-19 MED ORDER — ARMC OPHTHALMIC DILATING DROPS
1.0000 "application " | OPHTHALMIC | Status: DC | PRN
  Administered 2018-05-19 (×3): 1 via OPHTHALMIC

## 2018-05-19 MED ORDER — MIDAZOLAM HCL 2 MG/2ML IJ SOLN
INTRAMUSCULAR | Status: DC | PRN
  Administered 2018-05-19: 1 mg via INTRAVENOUS

## 2018-05-19 MED ORDER — BRIMONIDINE TARTRATE-TIMOLOL 0.2-0.5 % OP SOLN
OPHTHALMIC | Status: DC | PRN
  Administered 2018-05-19: 1 [drp] via OPHTHALMIC

## 2018-05-19 MED ORDER — LIDOCAINE HCL (PF) 2 % IJ SOLN
INTRAOCULAR | Status: DC | PRN
  Administered 2018-05-19: .5 mL

## 2018-05-19 MED ORDER — ACETAMINOPHEN 325 MG PO TABS: 325.0000 mg | ORAL_TABLET | ORAL | Status: DC | PRN

## 2018-05-19 MED ORDER — FENTANYL CITRATE (PF) 100 MCG/2ML IJ SOLN
INTRAMUSCULAR | Status: DC | PRN
  Administered 2018-05-19: 50 ug via INTRAVENOUS

## 2018-05-19 SURGICAL SUPPLY — 27 items
CANNULA ANT/CHMB 27G (MISCELLANEOUS) ×1 IMPLANT
CANNULA ANT/CHMB 27GA (MISCELLANEOUS) ×3 IMPLANT
CARTRIDGE ABBOTT (MISCELLANEOUS) IMPLANT
GLOVE SURG LX 7.5 STRW (GLOVE) ×2
GLOVE SURG LX STRL 7.5 STRW (GLOVE) ×1 IMPLANT
GLOVE SURG TRIUMPH 8.0 PF LTX (GLOVE) ×3 IMPLANT
GOWN STRL REUS W/ TWL LRG LVL3 (GOWN DISPOSABLE) ×2 IMPLANT
GOWN STRL REUS W/TWL LRG LVL3 (GOWN DISPOSABLE) ×4
LENS IOL TECNIS ITEC 21.5 (Intraocular Lens) ×2 IMPLANT
MARKER SKIN DUAL TIP RULER LAB (MISCELLANEOUS) ×3 IMPLANT
NDL FILTER BLUNT 18X1 1/2 (NEEDLE) ×1 IMPLANT
NDL RETROBULBAR .5 NSTRL (NEEDLE) IMPLANT
NEEDLE FILTER BLUNT 18X 1/2SAF (NEEDLE) ×2
NEEDLE FILTER BLUNT 18X1 1/2 (NEEDLE) ×1 IMPLANT
PACK CATARACT BRASINGTON (MISCELLANEOUS) ×3 IMPLANT
PACK EYE AFTER SURG (MISCELLANEOUS) ×3 IMPLANT
PACK OPTHALMIC (MISCELLANEOUS) ×3 IMPLANT
RING MALYGIN 7.0 (MISCELLANEOUS) IMPLANT
SUT ETHILON 10-0 CS-B-6CS-B-6 (SUTURE)
SUT VICRYL  9 0 (SUTURE)
SUT VICRYL 9 0 (SUTURE) IMPLANT
SUTURE EHLN 10-0 CS-B-6CS-B-6 (SUTURE) IMPLANT
SYR 3ML LL SCALE MARK (SYRINGE) ×3 IMPLANT
SYR 5ML LL (SYRINGE) ×3 IMPLANT
SYR TB 1ML LUER SLIP (SYRINGE) ×3 IMPLANT
WATER STERILE IRR 500ML POUR (IV SOLUTION) ×3 IMPLANT
WIPE NON LINTING 3.25X3.25 (MISCELLANEOUS) ×3 IMPLANT

## 2018-05-19 NOTE — Transfer of Care (Addendum)
Immediate Anesthesia Transfer of Care Note  Patient: Christopher Melton  Procedure(s) Performed: CATARACT EXTRACTION PHACO AND INTRAOCULAR LENS PLACEMENT (IOC) LEFT (Left Eye)  Patient Location: PACU  Anesthesia Type: MAC  Level of Consciousness: awake, alert  and patient cooperative  Airway and Oxygen Therapy: Patient Spontanous Breathing and Patient connected to supplemental oxygen  Post-op Assessment: Post-op Vital signs reviewed, Patient's Cardiovascular Status Stable, Respiratory Function Stable, Patent Airway and No signs of Nausea or vomiting  Post-op Vital Signs: Reviewed and stable  Complications: No apparent anesthesia complications

## 2018-05-19 NOTE — Anesthesia Procedure Notes (Signed)
Procedure Name: Monon Performed by: Cameron Ali, CRNA Pre-anesthesia Checklist: Patient identified, Emergency Drugs available, Suction available, Timeout performed and Patient being monitored Patient Re-evaluated:Patient Re-evaluated prior to induction Oxygen Delivery Method: Nasal cannula Placement Confirmation: positive ETCO2

## 2018-05-19 NOTE — H&P (Signed)
The History and Physical notes are on paper, have been signed, and are to be scanned. The patient remains stable and unchanged from the H&P.   Previous H&P reviewed, patient examined, and there are no changes.  Christopher Melton 05/19/2018 9:02 AM

## 2018-05-19 NOTE — Anesthesia Preprocedure Evaluation (Signed)
Anesthesia Evaluation  Patient identified by MRN, date of birth, ID band Patient awake    Reviewed: Allergy & Precautions, H&P , NPO status , Patient's Chart, lab work & pertinent test results, reviewed documented beta blocker date and time   Airway Mallampati: II  TM Distance: >3 FB Neck ROM: full    Dental no notable dental hx.    Pulmonary former smoker,    Pulmonary exam normal breath sounds clear to auscultation       Cardiovascular Exercise Tolerance: Good hypertension,  Rhythm:regular Rate:Normal     Neuro/Psych negative neurological ROS  negative psych ROS   GI/Hepatic Neg liver ROS, GERD  ,  Endo/Other  negative endocrine ROS  Renal/GU negative Renal ROS  negative genitourinary   Musculoskeletal   Abdominal   Peds  Hematology negative hematology ROS (+)   Anesthesia Other Findings   Reproductive/Obstetrics negative OB ROS                             Anesthesia Physical Anesthesia Plan  ASA: II  Anesthesia Plan: MAC   Post-op Pain Management:    Induction:   PONV Risk Score and Plan:   Airway Management Planned:   Additional Equipment:   Intra-op Plan:   Post-operative Plan:   Informed Consent: I have reviewed the patients History and Physical, chart, labs and discussed the procedure including the risks, benefits and alternatives for the proposed anesthesia with the patient or authorized representative who has indicated his/her understanding and acceptance.     Dental Advisory Given  Plan Discussed with: CRNA  Anesthesia Plan Comments:         Anesthesia Quick Evaluation  

## 2018-05-19 NOTE — Op Note (Signed)
OPERATIVE NOTE  ALEXANDR YAWORSKI [id number] 05/19/2018   PREOPERATIVE DIAGNOSIS:  Nuclear sclerotic cataract left eye. H25.12   POSTOPERATIVE DIAGNOSIS:    Nuclear sclerotic cataract left eye.     PROCEDURE:  Phacoemusification with posterior chamber intraocular lens placement of the left eye   LENS:   Implant Name Type Inv. Item Serial No. Manufacturer Lot No. LRB No. Used  LENS IOL DIOP 21.5 - [id number] Intraocular Lens LENS IOL DIOP 21.5 [id number]  Left 1        ULTRASOUND TIME: 15  % of 1 minutes 3 seconds, CDE 9.4  SURGEON:  Wyonia Hough, MD   ANESTHESIA:  Topical with tetracaine drops and 2% Xylocaine jelly, augmented with 1% preservative-free intracameral lidocaine.    COMPLICATIONS:  None.   DESCRIPTION OF PROCEDURE:  The patient was identified in the holding room and transported to the operating room and placed in the supine position under the operating microscope.  The left eye was identified as the operative eye and it was prepped and draped in the usual sterile ophthalmic fashion.   A 1 millimeter clear-corneal paracentesis was made at the 1:30 position.  0.5 ml of preservative-free 1% lidocaine was injected into the anterior chamber.  The anterior chamber was filled with Viscoat viscoelastic.  A 2.4 millimeter keratome was used to make a near-clear corneal incision at the 10:30 position.  .  A curvilinear capsulorrhexis was made with a cystotome and capsulorrhexis forceps.  Balanced salt solution was used to hydrodissect and hydrodelineate the nucleus.   Phacoemulsification was then used in stop and chop fashion to remove the lens nucleus and epinucleus.  The remaining cortex was then removed using the irrigation and aspiration handpiece. Provisc was then placed into the capsular bag to distend it for lens placement.  A lens was then injected into the capsular bag.  The remaining viscoelastic was aspirated.   Wounds were hydrated with balanced salt  solution.  The anterior chamber was inflated to a physiologic pressure with balanced salt solution.  No wound leaks were noted. Cefuroxime 0.1 ml of a 10mg /ml solution was injected into the anterior chamber for a dose of 1 mg of intracameral antibiotic at the completion of the case.  Timolol and Brimonidine drops were applied to the eye.  The patient was taken to the recovery room in stable condition without complications of anesthesia or surgery.  Jalen Oberry 05/19/2018, 10:22 AM

## 2018-05-19 NOTE — Anesthesia Postprocedure Evaluation (Signed)
Anesthesia Post Note  Patient: Christopher Melton  Procedure(s) Performed: CATARACT EXTRACTION PHACO AND INTRAOCULAR LENS PLACEMENT (IOC) LEFT (Left Eye)  Patient location during evaluation: PACU Anesthesia Type: MAC Level of consciousness: awake and alert Pain management: pain level controlled Vital Signs Assessment: post-procedure vital signs reviewed and stable Respiratory status: spontaneous breathing, nonlabored ventilation, respiratory function stable and patient connected to nasal cannula oxygen Cardiovascular status: stable and blood pressure returned to baseline Postop Assessment: no apparent nausea or vomiting Anesthetic complications: no    Alisa Graff

## 2018-05-20 ENCOUNTER — Encounter: Payer: Self-pay | Admitting: Ophthalmology

## 2018-05-20 NOTE — Addendum Note (Signed)
Addendum  created 05/20/18 1354 by Ronelle Nigh, MD   Intraprocedure Event edited, Intraprocedure Staff edited, Sign clinical note

## 2018-06-21 ENCOUNTER — Other Ambulatory Visit: Payer: Self-pay

## 2018-06-21 ENCOUNTER — Encounter: Payer: Self-pay | Admitting: *Deleted

## 2018-06-28 NOTE — Discharge Instructions (Signed)

## 2018-06-30 ENCOUNTER — Encounter
Admission: RE | Disposition: A | Discharge status: Home / Self Care | Payer: Self-pay | Source: Ambulatory Visit | Attending: Ophthalmology

## 2018-06-30 ENCOUNTER — Ambulatory Visit: Payer: Medicare Other | Admitting: Anesthesiology

## 2018-06-30 ENCOUNTER — Ambulatory Visit
Admission: RE | Admit: 2018-06-30 | Discharge: 2018-06-30 | Disposition: A | Discharge status: Home / Self Care | Payer: Medicare Other | Source: Ambulatory Visit | Attending: Ophthalmology | Admitting: Ophthalmology

## 2018-06-30 DIAGNOSIS — M1990 Unspecified osteoarthritis, unspecified site: Secondary | ICD-10-CM | POA: Insufficient documentation

## 2018-06-30 DIAGNOSIS — K219 Gastro-esophageal reflux disease without esophagitis: Secondary | ICD-10-CM | POA: Insufficient documentation

## 2018-06-30 DIAGNOSIS — Z87891 Personal history of nicotine dependence: Secondary | ICD-10-CM | POA: Insufficient documentation

## 2018-06-30 DIAGNOSIS — I10 Essential (primary) hypertension: Secondary | ICD-10-CM | POA: Insufficient documentation

## 2018-06-30 DIAGNOSIS — Z7982 Long term (current) use of aspirin: Secondary | ICD-10-CM | POA: Diagnosis not present

## 2018-06-30 DIAGNOSIS — Z79899 Other long term (current) drug therapy: Secondary | ICD-10-CM | POA: Insufficient documentation

## 2018-06-30 DIAGNOSIS — H2511 Age-related nuclear cataract, right eye: Secondary | ICD-10-CM | POA: Insufficient documentation

## 2018-06-30 HISTORY — PX: CATARACT EXTRACTION W/PHACO: SHX586

## 2018-06-30 SURGERY — PHACOEMULSIFICATION, CATARACT, WITH IOL INSERTION
Anesthesia: Monitor Anesthesia Care | Site: Eye | Laterality: Right

## 2018-06-30 MED ORDER — ONDANSETRON HCL 4 MG/2ML IJ SOLN: 4.0000 mg | Freq: Once | INTRAMUSCULAR | Status: DC | PRN

## 2018-06-30 MED ORDER — LACTATED RINGERS IV SOLN: 10.0000 mL/h | INTRAVENOUS | Status: DC

## 2018-06-30 MED ORDER — TETRACAINE HCL 0.5 % OP SOLN
1.0000 [drp] | OPHTHALMIC | Status: DC | PRN
  Administered 2018-06-30 (×2): 1 [drp] via OPHTHALMIC

## 2018-06-30 MED ORDER — EPINEPHRINE PF 1 MG/ML IJ SOLN
INTRAOCULAR | Status: DC | PRN
  Administered 2018-06-30: 59 mL via OPHTHALMIC

## 2018-06-30 MED ORDER — LIDOCAINE HCL (PF) 2 % IJ SOLN
INTRAOCULAR | Status: DC | PRN
  Administered 2018-06-30: 2 mL

## 2018-06-30 MED ORDER — FENTANYL CITRATE (PF) 100 MCG/2ML IJ SOLN
INTRAMUSCULAR | Status: DC | PRN
  Administered 2018-06-30: 50 ug via INTRAVENOUS

## 2018-06-30 MED ORDER — MIDAZOLAM HCL 2 MG/2ML IJ SOLN
INTRAMUSCULAR | Status: DC | PRN
  Administered 2018-06-30: 2 mg via INTRAVENOUS

## 2018-06-30 MED ORDER — BRIMONIDINE TARTRATE-TIMOLOL 0.2-0.5 % OP SOLN
OPHTHALMIC | Status: DC | PRN
  Administered 2018-06-30: 1 [drp] via OPHTHALMIC

## 2018-06-30 MED ORDER — CEFUROXIME OPHTHALMIC INJECTION 1 MG/0.1 ML
INJECTION | OPHTHALMIC | Status: DC | PRN
  Administered 2018-06-30: 0.1 mL via INTRACAMERAL

## 2018-06-30 MED ORDER — NA HYALUR & NA CHOND-NA HYALUR 0.4-0.35 ML IO KIT
PACK | INTRAOCULAR | Status: DC | PRN
  Administered 2018-06-30: 1 mL via INTRAOCULAR

## 2018-06-30 MED ORDER — ARMC OPHTHALMIC DILATING DROPS
1.0000 "application " | OPHTHALMIC | Status: DC | PRN
  Administered 2018-06-30 (×3): 1 via OPHTHALMIC

## 2018-06-30 MED ORDER — MOXIFLOXACIN HCL 0.5 % OP SOLN
1.0000 [drp] | OPHTHALMIC | Status: DC | PRN
  Administered 2018-06-30 (×3): 1 [drp] via OPHTHALMIC

## 2018-06-30 SURGICAL SUPPLY — 20 items
CANNULA ANT/CHMB 27G (MISCELLANEOUS) ×1 IMPLANT
CANNULA ANT/CHMB 27GA (MISCELLANEOUS) ×3 IMPLANT
GLOVE SURG LX 7.5 STRW (GLOVE) ×2
GLOVE SURG LX STRL 7.5 STRW (GLOVE) ×1 IMPLANT
GLOVE SURG TRIUMPH 8.0 PF LTX (GLOVE) ×3 IMPLANT
GOWN STRL REUS W/ TWL LRG LVL3 (GOWN DISPOSABLE) ×2 IMPLANT
GOWN STRL REUS W/TWL LRG LVL3 (GOWN DISPOSABLE) ×4
LENS IOL TECNIS ITEC 22.0 (Intraocular Lens) ×2 IMPLANT
MARKER SKIN DUAL TIP RULER LAB (MISCELLANEOUS) ×3 IMPLANT
NDL FILTER BLUNT 18X1 1/2 (NEEDLE) ×1 IMPLANT
NEEDLE FILTER BLUNT 18X 1/2SAF (NEEDLE) ×2
NEEDLE FILTER BLUNT 18X1 1/2 (NEEDLE) ×1 IMPLANT
PACK CATARACT BRASINGTON (MISCELLANEOUS) ×3 IMPLANT
PACK EYE AFTER SURG (MISCELLANEOUS) ×3 IMPLANT
PACK OPTHALMIC (MISCELLANEOUS) ×3 IMPLANT
SYR 3ML LL SCALE MARK (SYRINGE) ×3 IMPLANT
SYR 5ML LL (SYRINGE) ×3 IMPLANT
SYR TB 1ML LUER SLIP (SYRINGE) ×3 IMPLANT
WATER STERILE IRR 500ML POUR (IV SOLUTION) ×3 IMPLANT
WIPE NON LINTING 3.25X3.25 (MISCELLANEOUS) ×3 IMPLANT

## 2018-06-30 NOTE — Op Note (Signed)
LOCATION:  [place]   PREOPERATIVE DIAGNOSIS:    Nuclear sclerotic cataract right eye. H25.11   POSTOPERATIVE DIAGNOSIS:  Nuclear sclerotic cataract right eye.     PROCEDURE:  Phacoemusification with posterior chamber intraocular lens placement of the right eye   LENS:   Implant Name Type Inv. Item Serial No. Manufacturer Lot No. LRB No. Used  LENS IOL DIOP 22.0 - [id number] Intraocular Lens LENS IOL DIOP 22.0 [id number]  Right 1        ULTRASOUND TIME: 19 % of 1 minutes, 11 seconds.  CDE 13.2   SURGEON:  Wyonia Hough, MD   ANESTHESIA:  Topical with tetracaine drops and 2% Xylocaine jelly, augmented with 1% preservative-free intracameral lidocaine.    COMPLICATIONS:  None.   DESCRIPTION OF PROCEDURE:  The patient was identified in the holding room and transported to the operating room and placed in the supine position under the operating microscope.  The right eye was identified as the operative eye and it was prepped and draped in the usual sterile ophthalmic fashion.   A 1 millimeter clear-corneal paracentesis was made at the 12:00 position.  0.5 ml of preservative-free 1% lidocaine was injected into the anterior chamber. The anterior chamber was filled with Viscoat viscoelastic.  A 2.4 millimeter keratome was used to make a near-clear corneal incision at the 9:00 position.  A curvilinear capsulorrhexis was made with a cystotome and capsulorrhexis forceps.  Balanced salt solution was used to hydrodissect and hydrodelineate the nucleus.   Phacoemulsification was then used in stop and chop fashion to remove the lens nucleus and epinucleus.  The remaining cortex was then removed using the irrigation and aspiration handpiece. Provisc was then placed into the capsular bag to distend it for lens placement.  A lens was then injected into the capsular bag.  The remaining viscoelastic was aspirated.   Wounds were hydrated with balanced salt solution.  The anterior  chamber was inflated to a physiologic pressure with balanced salt solution.  No wound leaks were noted. Cefuroxime 0.1 ml of a 10mg /ml solution was injected into the anterior chamber for a dose of 1 mg of intracameral antibiotic at the completion of the case.   Timolol and Brimonidine drops were applied to the eye.  The patient was taken to the recovery room in stable condition without complications of anesthesia or surgery.   Christopher Melton 06/30/2018, 8:59 AM

## 2018-06-30 NOTE — Anesthesia Preprocedure Evaluation (Signed)
Anesthesia Evaluation  Patient identified by MRN, date of birth, ID band Patient awake    Reviewed: Allergy & Precautions, H&P , NPO status , Patient's Chart, lab work & pertinent test results  Airway Mallampati: II  TM Distance: >3 FB Neck ROM: full    Dental no notable dental hx.    Pulmonary former smoker,    Pulmonary exam normal        Cardiovascular hypertension, Normal cardiovascular exam     Neuro/Psych    GI/Hepatic Neg liver ROS, Medicated,  Endo/Other  negative endocrine ROS  Renal/GU negative Renal ROS     Musculoskeletal   Abdominal   Peds  Hematology negative hematology ROS (+)   Anesthesia Other Findings   Reproductive/Obstetrics negative OB ROS                             Anesthesia Physical Anesthesia Plan  ASA: II  Anesthesia Plan: MAC   Post-op Pain Management:    Induction:   PONV Risk Score and Plan:   Airway Management Planned:   Additional Equipment:   Intra-op Plan:   Post-operative Plan:   Informed Consent: I have reviewed the patients History and Physical, chart, labs and discussed the procedure including the risks, benefits and alternatives for the proposed anesthesia with the patient or authorized representative who has indicated his/her understanding and acceptance.     Plan Discussed with:   Anesthesia Plan Comments:         Anesthesia Quick Evaluation

## 2018-06-30 NOTE — Anesthesia Procedure Notes (Signed)
Procedure Name: MAC Date/Time: 06/30/2018 8:42 AM Performed by: Janna Arch, CRNA Pre-anesthesia Checklist: Patient identified, Emergency Drugs available, Suction available, Timeout performed and Patient being monitored Patient Re-evaluated:Patient Re-evaluated prior to induction Oxygen Delivery Method: Nasal cannula Placement Confirmation: positive ETCO2

## 2018-06-30 NOTE — Transfer of Care (Signed)
Immediate Anesthesia Transfer of Care Note  Patient: Christopher Melton  Procedure(s) Performed: CATARACT EXTRACTION PHACO AND INTRAOCULAR LENS PLACEMENT (IOC) RIGHT (Right Eye)  Patient Location: PACU  Anesthesia Type: MAC  Level of Consciousness: awake, alert  and patient cooperative  Airway and Oxygen Therapy: Patient Spontanous Breathing and Patient connected to supplemental oxygen  Post-op Assessment: Post-op Vital signs reviewed, Patient's Cardiovascular Status Stable, Respiratory Function Stable, Patent Airway and No signs of Nausea or vomiting  Post-op Vital Signs: Reviewed and stable  Complications: No apparent anesthesia complications

## 2018-06-30 NOTE — H&P (Signed)
The History and Physical notes are on paper, have been signed, and are to be scanned. The patient remains stable and unchanged from the H&P.   Previous H&P reviewed, patient examined, and there are no changes.  Christopher Melton 06/30/2018 8:16 AM

## 2018-06-30 NOTE — Anesthesia Postprocedure Evaluation (Signed)
Anesthesia Post Note  Patient: Christopher Melton  Procedure(s) Performed: CATARACT EXTRACTION PHACO AND INTRAOCULAR LENS PLACEMENT (IOC) RIGHT (Right Eye)  Patient location during evaluation: PACU Anesthesia Type: MAC Level of consciousness: awake and alert Pain management: pain level controlled Vital Signs Assessment: post-procedure vital signs reviewed and stable Respiratory status: spontaneous breathing Cardiovascular status: blood pressure returned to baseline Anesthetic complications: no    Jaci Standard, III,  Sindy Mccune D

## 2018-07-01 ENCOUNTER — Encounter: Payer: Self-pay | Admitting: Ophthalmology

## 2019-01-11 DIAGNOSIS — M791 Myalgia, unspecified site: Secondary | ICD-10-CM | POA: Insufficient documentation

## 2019-01-11 DIAGNOSIS — T466X5A Adverse effect of antihyperlipidemic and antiarteriosclerotic drugs, initial encounter: Secondary | ICD-10-CM | POA: Insufficient documentation

## 2019-02-22 ENCOUNTER — Other Ambulatory Visit: Payer: Self-pay

## 2019-02-22 ENCOUNTER — Ambulatory Visit: Payer: Medicare Other | Admitting: Urology

## 2019-02-22 ENCOUNTER — Encounter: Payer: Self-pay | Admitting: Urology

## 2019-02-22 VITALS — BP 150/67 | HR 86 | Ht 69.0 in | Wt 205.2 lb

## 2019-02-22 DIAGNOSIS — E785 Hyperlipidemia, unspecified: Secondary | ICD-10-CM | POA: Insufficient documentation

## 2019-02-22 DIAGNOSIS — N400 Enlarged prostate without lower urinary tract symptoms: Secondary | ICD-10-CM | POA: Insufficient documentation

## 2019-02-22 DIAGNOSIS — M1990 Unspecified osteoarthritis, unspecified site: Secondary | ICD-10-CM | POA: Insufficient documentation

## 2019-02-22 DIAGNOSIS — N401 Enlarged prostate with lower urinary tract symptoms: Secondary | ICD-10-CM

## 2019-02-22 DIAGNOSIS — Z86010 Personal history of colonic polyps: Secondary | ICD-10-CM | POA: Insufficient documentation

## 2019-02-22 DIAGNOSIS — G250 Essential tremor: Secondary | ICD-10-CM | POA: Insufficient documentation

## 2019-02-22 DIAGNOSIS — Z87442 Personal history of urinary calculi: Secondary | ICD-10-CM

## 2019-02-22 DIAGNOSIS — N529 Male erectile dysfunction, unspecified: Secondary | ICD-10-CM | POA: Insufficient documentation

## 2019-02-22 DIAGNOSIS — R109 Unspecified abdominal pain: Secondary | ICD-10-CM | POA: Diagnosis not present

## 2019-02-22 DIAGNOSIS — I779 Disorder of arteries and arterioles, unspecified: Secondary | ICD-10-CM | POA: Insufficient documentation

## 2019-02-22 LAB — URINALYSIS, COMPLETE
Bilirubin, UA: NEGATIVE
Glucose, UA: NEGATIVE
Ketones, UA: NEGATIVE
Leukocytes,UA: NEGATIVE
Nitrite, UA: NEGATIVE
Protein,UA: NEGATIVE
RBC, UA: NEGATIVE
Specific Gravity, UA: 1.02 (ref 1.005–1.030)
Urobilinogen, Ur: 0.2 mg/dL (ref 0.2–1.0)
pH, UA: 5 (ref 5.0–7.5)

## 2019-02-22 LAB — MICROSCOPIC EXAMINATION
Bacteria, UA: NONE SEEN
Epithelial Cells (non renal): NONE SEEN /hpf (ref 0–10)
RBC: NONE SEEN /hpf (ref 0–2)

## 2019-02-22 LAB — BLADDER SCAN AMB NON-IMAGING

## 2019-02-22 MED ORDER — TAMSULOSIN HCL 0.4 MG PO CAPS: 0.4000 mg | ORAL_CAPSULE | Freq: Every day | ORAL | 0 refills | Status: DC

## 2019-02-22 NOTE — Progress Notes (Signed)
02/22/2019 4:00 PM   Christopher Melton Feb 15, 1934 [id number]  Referring provider: Kirk Ruths, MD [facility] [place], [place] [address]  Chief Complaint  Patient presents with  . Establish Care    HPI: Christopher Melton is an 85 year old male seen in consultation at request of Dr. Ouida Sills for evaluation of left flank pain.  He has a history of stone disease and BPH.  Last weekend he was traveling in [place] and had onset of left flank pain radiating to the left lower quadrant which felt similar to previous episodes of renal colic.  There were no identifiable precipitating, aggravating or alleviating factors.  Intensity was rated severe.  Due to the severity of his pain he was seen in the ED at [facility].  He states he had a CT of the abdomen pelvis which did not show a stone or other abnormalities.  Unfortunately he was not given a disc of the CT.  He has continued to have intermittent pain.  Last episode of renal colic was in 1914.  He states he has never required intervention for urinary tract calculi.  He denies bothersome lower urinary tract symptoms.  IPSS completed today was 5/1. Denies gross hematuria   PMH: Past Medical History:  Diagnosis Date  . BPH (benign prostatic hyperplasia)   . Carotid stenosis   . Colon polyps   . Erectile dysfunction   . Familial tremor   . GERD (gastroesophageal reflux disease)   . Hyperlipidemia   . Kidney stone   . Osteoarthritis    hands  . Trigger finger     Surgical History: Past Surgical History:  Procedure Laterality Date  . [place]  . CATARACT EXTRACTION W/PHACO Left 05/19/2018   Procedure: CATARACT EXTRACTION PHACO AND INTRAOCULAR LENS PLACEMENT ([place]) LEFT;  Surgeon: Leandrew Koyanagi, MD;  Location: [place];  Service: Ophthalmology;  Laterality: Left;  . CATARACT EXTRACTION W/PHACO Right 06/30/2018   Procedure:  CATARACT EXTRACTION PHACO AND INTRAOCULAR LENS PLACEMENT ([place]) RIGHT;  Surgeon: Leandrew Koyanagi, MD;  Location: [place];  Service: Ophthalmology;  Laterality: Right;  . COLONOSCOPY    . COLONOSCOPY WITH PROPOFOL N/A 06/19/2015   Procedure: COLONOSCOPY WITH PROPOFOL;  Surgeon: Lollie Sails, MD;  Location: [facility] ENDOSCOPY;  Service: Endoscopy;  Laterality: N/A;  . ESOPHAGOGASTRODUODENOSCOPY (EGD) WITH PROPOFOL N/A 06/19/2015   Procedure: ESOPHAGOGASTRODUODENOSCOPY (EGD) WITH PROPOFOL;  Surgeon: Lollie Sails, MD;  Location: [facility] END[facility]COPY;  Service: Endoscopy;  Laterality: N/A;  . TRIGGER FINGER RELEASE Bilateral     Home Medications:  Allergies as of 02/22/2019      Reactions   Ace Inhibitors    Lovastatin    dizziness   Oxycodone Nausea And Vomiting      Medication List       Accurate as of February 22, 2019  4:00 PM. If you have any questions, ask your nurse or doctor.        aspirin 81 MG tablet Take 81 mg by mouth daily.   CANNABIDIOL PO Take by mouth. (hemp extract oil)   clotrimazole-betamethasone cream Commonly known as:  LOTRISONE APPLY TO AFFECTED AREA TWICE A DAY   losartan-hydrochlorothiazide 100-12.5 MG tablet Commonly known as:  HYZAAR Take 1 tablet by mouth daily.   MULTI VITAMIN DAILY PO Take by mouth daily. doTerra lifelong vitality pack   pantoprazole 40 MG tablet Commonly known as:  PROTONIX   sildenafil 20  MG tablet Commonly known as:  REVATIO Take by mouth.       Allergies:  Allergies  Allergen Reactions  . Ace Inhibitors   . Lovastatin     dizziness  . Oxycodone Nausea And Vomiting    Family History: No family history on file.  Social History:  reports that he quit smoking about 42 years ago. He has never used smokeless tobacco. He reports that he does not drink alcohol or use drugs.  ROS: UROLOGY Frequent Urination?: No Hard to postpone urination?: No Burning/pain with urination?: No Get up at night to  urinate?: No Leakage of urine?: No Urine stream starts and stops?: No Trouble starting stream?: No Do you have to strain to urinate?: No Blood in urine?: No Urinary tract infection?: No Sexually transmitted disease?: No Injury to kidneys or bladder?: No Painful intercourse?: No Weak stream?: No Erection problems?: No Penile pain?: No  Gastrointestinal Nausea?: No Vomiting?: No Indigestion/heartburn?: No Diarrhea?: No Constipation?: No  Constitutional Fever: No Night sweats?: No Weight loss?: No Fatigue?: No  Skin Skin rash/lesions?: No Itching?: No  Eyes Blurred vision?: No Double vision?: No  Ears/Nose/Throat Sore throat?: No Sinus problems?: No  Hematologic/Lymphatic Swollen glands?: No Easy bruising?: No  Cardiovascular Leg swelling?: No Chest pain?: No  Respiratory Cough?: No Shortness of breath?: No  Endocrine Excessive thirst?: No  Musculoskeletal Back pain?: Yes Joint pain?: No  Neurological Headaches?: No Dizziness?: No  Psychologic Depression?: No Anxiety?: No  Physical Exam: BP (!) 150/67 (BP Location: Left Arm, Patient Position: Sitting, Cuff Size: Normal)   Pulse 86   Ht 5\' 9"  (1.753 m)   Wt 205 lb 3.2 oz (93.1 kg)   BMI 30.30 kg/m   Constitutional:  Alert and oriented, No acute distress. HEENT: [place] AT, moist mucus membranes.  Trachea midline, no masses. Cardiovascular: No clubbing, cyanosis, or edema. Respiratory: Normal respiratory effort, no increased work of breathing. GI: Abdomen is soft, nontender, nondistended, no abdominal masses GU: No CVA tenderness.  Abdomen soft, nontender Lymph: No cervical or inguinal lymphadenopathy. Skin: No rashes, bruises or suspicious lesions. Neurologic: Grossly intact, no focal deficits, moving all 4 extremities. Psychiatric: Normal mood and affect.   Assessment & Plan:   85 year old male with left flank pain radiating to the left lower quadrant.  His urinalysis today is clear and  shows no microhematuria.  He had a stone protocol CT of the abdomen pelvis which apparently showed no abnormalities.  He was informed that stone protocol CTs are a very sensitive test but not 100%.  He did sign a record release and will release request the CT report.  I informed him of stone was present and it would be small and most likely possible.  Rx tamsulosin sent to pharmacy.  He has a history of BPH and PVR by bladder scan was 0 mL.   Abbie Sons, [place] [address], [place], [place] [address] [phone]

## 2019-02-24 ENCOUNTER — Encounter: Payer: Self-pay | Admitting: Urology

## 2019-03-21 ENCOUNTER — Other Ambulatory Visit: Payer: Self-pay | Admitting: Family Medicine

## 2019-03-21 MED ORDER — TAMSULOSIN HCL 0.4 MG PO CAPS: 0.4000 mg | ORAL_CAPSULE | Freq: Every day | ORAL | 6 refills | Status: DC

## 2019-08-09 ENCOUNTER — Other Ambulatory Visit: Payer: Self-pay | Admitting: Gastroenterology

## 2019-08-09 DIAGNOSIS — R1313 Dysphagia, pharyngeal phase: Secondary | ICD-10-CM

## 2019-08-15 ENCOUNTER — Ambulatory Visit
Admission: RE | Admit: 2019-08-15 | Discharge: 2019-08-15 | Disposition: A | Discharge status: Home / Self Care | Payer: Medicare Other | Source: Ambulatory Visit | Attending: Gastroenterology | Admitting: Gastroenterology

## 2019-08-15 ENCOUNTER — Other Ambulatory Visit: Payer: Self-pay

## 2019-08-15 DIAGNOSIS — R1313 Dysphagia, pharyngeal phase: Secondary | ICD-10-CM | POA: Insufficient documentation

## 2019-09-04 ENCOUNTER — Emergency Department: Payer: Medicare Other

## 2019-09-04 ENCOUNTER — Emergency Department
Admission: EM | Admit: 2019-09-04 | Discharge: 2019-09-04 | Disposition: A | Discharge status: Home / Self Care | Payer: Medicare Other | Attending: Emergency Medicine | Admitting: Emergency Medicine

## 2019-09-04 ENCOUNTER — Other Ambulatory Visit: Payer: Self-pay

## 2019-09-04 DIAGNOSIS — U071 COVID-19: Secondary | ICD-10-CM | POA: Insufficient documentation

## 2019-09-04 DIAGNOSIS — N183 Chronic kidney disease, stage 3 unspecified: Secondary | ICD-10-CM | POA: Insufficient documentation

## 2019-09-04 DIAGNOSIS — Z87891 Personal history of nicotine dependence: Secondary | ICD-10-CM | POA: Diagnosis not present

## 2019-09-04 DIAGNOSIS — Z7982 Long term (current) use of aspirin: Secondary | ICD-10-CM | POA: Insufficient documentation

## 2019-09-04 DIAGNOSIS — R0602 Shortness of breath: Secondary | ICD-10-CM

## 2019-09-04 DIAGNOSIS — Z79899 Other long term (current) drug therapy: Secondary | ICD-10-CM | POA: Insufficient documentation

## 2019-09-04 DIAGNOSIS — I129 Hypertensive chronic kidney disease with stage 1 through stage 4 chronic kidney disease, or unspecified chronic kidney disease: Secondary | ICD-10-CM | POA: Diagnosis not present

## 2019-09-04 DIAGNOSIS — R0789 Other chest pain: Secondary | ICD-10-CM | POA: Diagnosis present

## 2019-09-04 DIAGNOSIS — E860 Dehydration: Secondary | ICD-10-CM | POA: Diagnosis not present

## 2019-09-04 LAB — CBC WITH DIFFERENTIAL/PLATELET
Abs Immature Granulocytes: 0.03 10*3/uL (ref 0.00–0.07)
Basophils Absolute: 0 10*3/uL (ref 0.0–0.1)
Basophils Relative: 0 %
Eosinophils Absolute: 0 10*3/uL (ref 0.0–0.5)
Eosinophils Relative: 1 %
HCT: 41.8 % (ref 39.0–52.0)
Hemoglobin: 14.2 g/dL (ref 13.0–17.0)
Immature Granulocytes: 1 %
Lymphocytes Relative: 32 %
Lymphs Abs: 1 10*3/uL (ref 0.7–4.0)
MCH: 29.3 pg (ref 26.0–34.0)
MCHC: 34 g/dL (ref 30.0–36.0)
MCV: 86.4 fL (ref 80.0–100.0)
Monocytes Absolute: 0.4 10*3/uL (ref 0.1–1.0)
Monocytes Relative: 14 %
Neutro Abs: 1.7 10*3/uL (ref 1.7–7.7)
Neutrophils Relative %: 52 %
Platelets: 171 10*3/uL (ref 150–400)
RBC: 4.84 MIL/uL (ref 4.22–5.81)
RDW: 13.4 % (ref 11.5–15.5)
WBC: 3.2 10*3/uL — ABNORMAL LOW (ref 4.0–10.5)
nRBC: 0 % (ref 0.0–0.2)

## 2019-09-04 LAB — CBC
HCT: 41.7 % (ref 39.0–52.0)
Hemoglobin: 14.2 g/dL (ref 13.0–17.0)
MCH: 29.5 pg (ref 26.0–34.0)
MCHC: 34.1 g/dL (ref 30.0–36.0)
MCV: 86.5 fL (ref 80.0–100.0)
Platelets: 177 10*3/uL (ref 150–400)
RBC: 4.82 MIL/uL (ref 4.22–5.81)
RDW: 13.3 % (ref 11.5–15.5)
WBC: 3.2 10*3/uL — ABNORMAL LOW (ref 4.0–10.5)
nRBC: 0 % (ref 0.0–0.2)

## 2019-09-04 LAB — BASIC METABOLIC PANEL
Anion gap: 12 (ref 5–15)
BUN: 17 mg/dL (ref 8–23)
CO2: 23 mmol/L (ref 22–32)
Calcium: 8.7 mg/dL — ABNORMAL LOW (ref 8.9–10.3)
Chloride: 96 mmol/L — ABNORMAL LOW (ref 98–111)
Creatinine, Ser: 0.98 mg/dL (ref 0.61–1.24)
GFR calc Af Amer: >60 mL/min (ref >60)
GFR calc non Af Amer: >60 mL/min (ref >60)
Glucose, Bld: 109 mg/dL — ABNORMAL HIGH (ref 70–99)
Potassium: 3.9 mmol/L (ref 3.5–5.1)
Sodium: 131 mmol/L — ABNORMAL LOW (ref 135–145)

## 2019-09-04 LAB — TROPONIN I (HIGH SENSITIVITY): Troponin I (High Sensitivity): 7 ng/L (ref <18)

## 2019-09-04 MED ORDER — SODIUM CHLORIDE 0.9% FLUSH: 3.0000 mL | Freq: Once | INTRAVENOUS | Status: DC

## 2019-09-04 MED ORDER — ONDANSETRON 4 MG PO TBDP: 4.0000 mg | ORAL_TABLET | Freq: Three times a day (TID) | ORAL | 0 refills | Status: DC | PRN

## 2019-09-04 MED ORDER — SODIUM CHLORIDE 0.9 % IV BOLUS
1000.0000 mL | Freq: Once | INTRAVENOUS | Status: AC
  Administered 2019-09-04: 1000 mL via INTRAVENOUS

## 2019-09-04 MED ORDER — METHYLPREDNISOLONE SODIUM SUCC 125 MG IJ SOLR
60.0000 mg | Freq: Once | INTRAMUSCULAR | Status: AC
  Administered 2019-09-04: 21:00:00 60 mg via INTRAVENOUS
  Filled 2019-09-04: qty 2

## 2019-09-04 MED ORDER — ONDANSETRON HCL 4 MG/2ML IJ SOLN
4.0000 mg | Freq: Once | INTRAMUSCULAR | Status: AC
  Administered 2019-09-04: 21:00:00 4 mg via INTRAVENOUS
  Filled 2019-09-04: qty 2

## 2019-09-04 NOTE — ED Provider Notes (Signed)
[facility] Emergency Department Provider Note  ____________________________________________  Time seen: Approximately 10:13 PM  I have reviewed the triage vital signs and the nursing notes.   HISTORY  Chief Complaint Shortness of Breath    HPI Christopher Melton is a 85 y.o. male with a history of BPH, hypertension, GERD who comes the ED complaining of  central chest pain described as tightness which is nonradiating, shortness of breath, nonproductive cough, generalized weakness nausea, loss of taste, loss of smell, decreased appetite and decreased oral intake for the past 4 days.  Patient notes that he was diagnosed with Covid 5 days ago.  No pleuritic or exertional pain.  No syncope or trauma.  Symptoms are constant, waxing and waning without aggravating or alleviating factors.     Past Medical History:  Diagnosis Date  . BPH (benign prostatic hyperplasia)   . Carotid stenosis   . Colon polyps   . Erectile dysfunction   . Familial tremor   . GERD (gastroesophageal reflux disease)   . Hyperlipidemia   . Kidney stone   . Osteoarthritis    hands  . Trigger finger      Patient Active Problem List   Diagnosis Date Noted  . BPH (benign prostatic hyperplasia) 02/22/2019  . Carotid artery disease ([place]) 02/22/2019  . Erectile dysfunction 02/22/2019  . Familial tremor 02/22/2019  . History of colon polyps 02/22/2019  . Hyperlipidemia 02/22/2019  . Osteoarthritis 02/22/2019  . Myalgia due to statin 01/11/2019  . Pain in limb 12/30/2016  . Health care maintenance 11/20/2015  . Benign hypertension with CKD (chronic kidney disease) stage III ([place]) 07/21/2014     Past Surgical History:  Procedure Laterality Date  . [place]  . CATARACT EXTRACTION W/PHACO Left 05/19/2018   Procedure: CATARACT EXTRACTION PHACO AND INTRAOCULAR LENS PLACEMENT ([place]) LEFT;  Surgeon: Leandrew Koyanagi, MD;  Location: [place];  Service:  Ophthalmology;  Laterality: Left;  . CATARACT EXTRACTION W/PHACO Right 06/30/2018   Procedure: CATARACT EXTRACTION PHACO AND INTRAOCULAR LENS PLACEMENT ([place]) RIGHT;  Surgeon: Leandrew Koyanagi, MD;  Location: [place];  Service: Ophthalmology;  Laterality: Right;  . COLONOSCOPY    . COLONOSCOPY WITH PROPOFOL N/A 06/19/2015   Procedure: COLONOSCOPY WITH PROPOFOL;  Surgeon: Lollie Sails, MD;  Location: [facility] ENDOSCOPY;  Service: Endoscopy;  Laterality: N/A;  . ESOPHAGOGASTRODUODENOSCOPY (EGD) WITH PROPOFOL N/A 06/19/2015   Procedure: ESOPHAGOGASTRODUODENOSCOPY (EGD) WITH PROPOFOL;  Surgeon: Lollie Sails, MD;  Location: [facility] ENDOSCOPY;  Service: Endoscopy;  Laterality: N/A;  . TRIGGER FINGER RELEASE Bilateral      Prior to Admission medications   Medication Sig Start Date End Date Taking? Authorizing Provider  aspirin 81 MG tablet Take 81 mg by mouth daily.    [provider]  CANNABIDIOL PO Take by mouth. (hemp extract oil)    [provider]  clotrimazole-betamethasone (LOTRISONE) cream APPLY TO AFFECTED AREA TWICE A DAY 10/20/18   [provider]  losartan-hydrochlorothiazide (HYZAAR) 100-12.5 MG tablet Take 1 tablet by mouth daily.    [provider]  Multiple Vitamin (MULTI VITAMIN DAILY PO) Take by mouth daily. doTerra lifelong vitality pack    [provider]  ondansetron (ZOFRAN ODT) 4 MG disintegrating tablet Take 1 tablet (4 mg total) by mouth every 8 (eight) hours as needed for nausea or vomiting. 09/04/19   Carrie Mew, MD  pantoprazole (PROTONIX) 40 MG tablet  12/19/16   [provider]  sildenafil (REVATIO) 20  MG tablet Take by mouth. 09/23/18   [provider]  tamsulosin (FLOMAX) 0.4 MG CAPS capsule Take 1 capsule (0.4 mg total) by mouth daily. 03/21/19   Stoioff, Ronda Fairly, MD     Allergies Ace inhibitors, Lovastatin, and Oxycodone   No family history on file.  Social History Social History    Tobacco Use  . Smoking status: Former Smoker    Quit date: 12/30/1976    Years since quitting: 42.7  . Smokeless tobacco: Never Used  Substance Use Topics  . Alcohol use: No  . Drug use: No    Review of Systems  Constitutional:   No fever positive chills.  ENT:   No sore throat. No rhinorrhea. Cardiovascular:   Positive chest tightness as above without syncope. Respiratory:   Positive shortness of breath and nonproductive cough. Gastrointestinal:   Negative for abdominal pain, vomiting and diarrhea.  Musculoskeletal:   Positive body aches All other systems reviewed and are negative except as documented above in ROS and HPI.  ____________________________________________   PHYSICAL EXAM:  VITAL SIGNS: ED Triage Vitals  Enc Vitals Group     BP 09/04/19 2008 (!) 159/83     Pulse Rate 09/04/19 2008 71     Resp 09/04/19 2008 17     Temp 09/04/19 2008 98.4 F (36.9 C)     Temp Source 09/04/19 2008 Oral     SpO2 09/04/19 2008 96 %     Weight 09/04/19 2000 205 lb (93 kg)     Height 09/04/19 2000 5\' 10"  (1.778 m)     Head Circumference --      Peak Flow --      Pain Score --      Pain Loc --      Pain Edu? --      Excl. in [place]? --     Vital signs reviewed, nursing assessments reviewed.   Constitutional:   Alert and oriented. Non-toxic appearance.  Ambulatory Eyes:   Conjunctivae are normal. EOMI. PERRL. ENT      Head:   Normocephalic and atraumatic.      Nose:   Wearing a mask.      Mouth/Throat:   Wearing a mask.      Neck:   No meningismus. Full ROM. Hematological/Lymphatic/Immunilogical:   No cervical lymphadenopathy. Cardiovascular:   RRR. Symmetric bilateral radial and DP pulses.  No murmurs. Cap refill less than 2 seconds. Respiratory:   Normal respiratory effort without tachypnea/retractions. Breath sounds are clear and equal bilaterally. No wheezes/rales/rhonchi. Gastrointestinal:   Soft and nontender. Non distended. There is no CVA tenderness.  No rebound,  rigidity, or guarding. Musculoskeletal:   Normal range of motion in all extremities. No joint effusions.  No lower extremity tenderness.  No edema.  Anterior chest wall tender to the touch reproducing symptoms. Neurologic:   Normal speech and language.  Motor grossly intact. No acute focal neurologic deficits are appreciated.  Skin:    Skin is warm, dry and intact. No rash noted.  No petechiae, purpura, or bullae.  ____________________________________________    LABS (pertinent positives/negatives) (all labs ordered are listed, but only abnormal results are displayed) Labs Reviewed  BASIC METABOLIC PANEL - Abnormal; Notable for the following components:      Result Value   Sodium 131 (*)    Chloride 96 (*)    Glucose, Bld 109 (*)    Calcium 8.7 (*)    All other components within normal limits  CBC - Abnormal; Notable  for the following components:   WBC 3.2 (*)    All other components within normal limits  CBC WITH DIFFERENTIAL/PLATELET - Abnormal; Notable for the following components:   WBC 3.2 (*)    All other components within normal limits  TROPONIN I (HIGH SENSITIVITY)  TROPONIN I (HIGH SENSITIVITY)   ____________________________________________   EKG  Interpreted by me Normal sinus rhythm rate of 68, normal axis intervals.  Poor R wave progression.  Normal ST segments and T waves.  ____________________________________________    RADIOLOGY  DG Chest Port 1 View  Result Date: 09/04/2019 CLINICAL DATA:  Medial chest tightness.  Shortness of breath. EXAM: PORTABLE CHEST 1 VIEW COMPARISON:  None. FINDINGS: Mild atelectasis in the bases. The heart, hila, mediastinum, lungs, and pleura are unremarkable. IMPRESSION: No active disease. Electronically Signed   By: Dorise Bullion III M.D   On: 09/04/2019 20:30    ____________________________________________   PROCEDURES Procedures  ____________________________________________    CLINICAL IMPRESSION / ASSESSMENT  AND PLAN / ED COURSE  Medications ordered in the ED: Medications  sodium chloride flush (NS) 0.9 % injection 3 mL (3 mLs Intravenous Not Given 09/04/19 2034)  ondansetron (ZOFRAN) injection 4 mg (4 mg Intravenous Given 09/04/19 2030)  sodium chloride 0.9 % bolus 1,000 mL (1,000 mLs Intravenous New Bag/Given 09/04/19 2029)  methylPREDNISolone sodium succinate (SOLU-MEDROL) 125 mg/2 mL injection 60 mg (60 mg Intravenous Given 09/04/19 2031)    Pertinent labs & imaging results that were available during my care of the patient were reviewed by me and considered in my medical decision making (see chart for details).  Christopher Melton was evaluated in Emergency Department on 09/04/2019 for the symptoms described in the history of present illness. He was evaluated in the context of the global COVID-19 pandemic, which necessitated consideration that the patient might be at risk for infection with the SARS-CoV-2 virus that causes COVID-19. Institutional protocols and algorithms that pertain to the evaluation of patients at risk for COVID-19 are in a state of rapid change based on information released by regulatory bodies including the CDC and federal and state organizations. These policies and algorithms were followed during the patient's care in the ED.     Clinical Course as of Sep 03 2212  Nancy Fetter Sep 04, 2019  2034 Oxygen saturation 97% standing/ambulatory in the treatment room.  Good air movement, no respiratory distress or significant tachypnea.  We will give him IV fluids for dehydration, check labs and chest x-ray.  Low-dose Solu-Medrol to help with inflammation and appetite stimulation.   [PS]    Clinical Course User Index [PS] Carrie Mew, MD    ----------------------------------------- 10:13 PM on 09/04/2019 -----------------------------------------  Tolerating oral intake, stable for discharge home with normal oxygenation, reassuring labs.  Unremarkable chest x-ray.  Given return  precautions for worsening symptoms to come back to ED for reassessment.   ____________________________________________   FINAL CLINICAL IMPRESSION(S) / ED DIAGNOSES    Final diagnoses:  COVID-19  Dehydration     ED Discharge Orders         Ordered    ondansetron (ZOFRAN ODT) 4 MG disintegrating tablet  Every 8 hours PRN     09/04/19 2213          Portions of this note were generated with dragon dictation software. Dictation errors may occur despite best attempts at proofreading.   Carrie Mew, MD 09/04/19 2215

## 2019-09-04 NOTE — ED Triage Notes (Signed)
Patient c/o medial chest tightness, SOB, cough, lightheadedness, weakness, and intermittent nausea X Wednesday. Patient dx with COVID on Tuesday.

## 2019-09-04 NOTE — ED Triage Notes (Signed)
FIRST NURSE NOTE:  Pt arrived via [facility], pt is COVID + as of 08/30/19, c/o chest tightness, DOE, 324 ASA given.  Pt reports he was feeling better but then got worse.  20G IV in place.    191/85 97% RA p-68  99.1 CBG 127

## 2019-09-04 NOTE — Discharge Instructions (Addendum)
Your labs today were okay.  Continue monitoring your symptoms and return to the emergency department if you have worsening shortness of breath, severe fatigue, passing out, or vomiting and inability to eat and drink.

## 2019-09-04 NOTE — ED Notes (Signed)
X-ray at bedside

## 2019-09-04 NOTE — ED Notes (Signed)
Dr. Stafford at bedside.  

## 2019-09-04 NOTE — ED Notes (Signed)
Pt given crackers and water for PO challenge

## 2019-12-02 ENCOUNTER — Other Ambulatory Visit: Payer: Self-pay | Admitting: Family Medicine

## 2019-12-02 ENCOUNTER — Ambulatory Visit
Admission: RE | Admit: 2019-12-02 | Discharge: 2019-12-02 | Disposition: A | Discharge status: Home / Self Care | Payer: Medicare Other | Source: Ambulatory Visit | Attending: Family Medicine | Admitting: Family Medicine

## 2019-12-02 ENCOUNTER — Other Ambulatory Visit: Payer: Self-pay

## 2019-12-02 DIAGNOSIS — M7989 Other specified soft tissue disorders: Secondary | ICD-10-CM | POA: Insufficient documentation

## 2020-06-10 IMAGING — RF DG ESOPHAGUS
12 of 18 series · 14 of 22 positions shown · non-contrast
Comparison: 01/24/2015

CLINICAL DATA: Dysphagia

EXAM:
ESOPHOGRAM / BARIUM SWALLOW / BARIUM TABLET STUDY
TECHNIQUE: Combined double contrast and single contrast examination performed
using effervescent crystals, thick barium liquid, and thin barium
liquid. The patient was observed with fluoroscopy swallowing a 13 mm
barium sulphate tablet.
FLUOROSCOPY TIME:  Fluoroscopy Time:  1 minutes 24 seconds
Radiation Exposure Index (if provided by the fluoroscopic device):
31.1 mGy
Number of Acquired Spot Images: 24

[Series 1: fluoro_barium 2fps_bw · 0.18mm/px · 1 of 1 slices shown (1 of 9)]
[im 1/1]
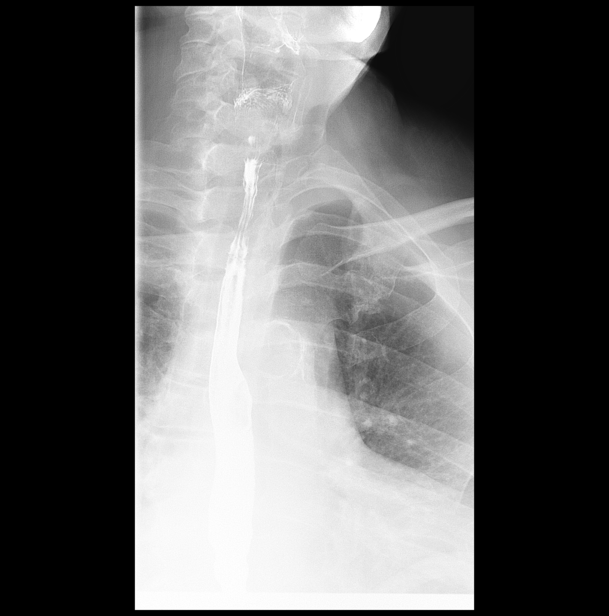

[Series 3: fluoro_barium 2fps_bw · 0.18mm/px · 2 of 7 frames shown (2 of 9)]
[frame 2/7]
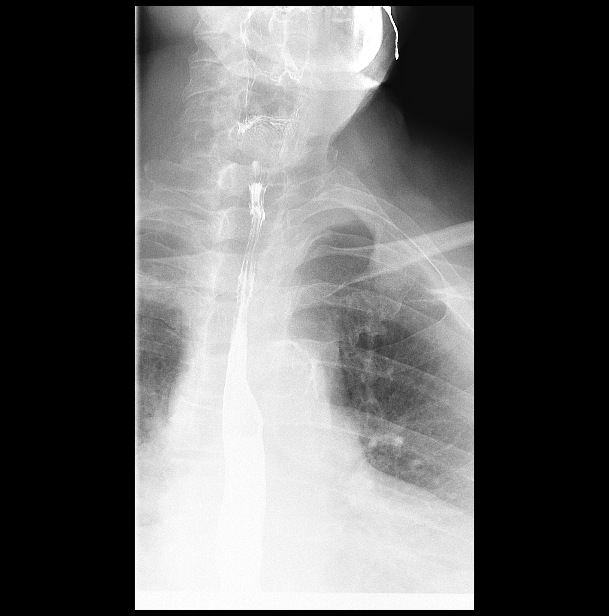
[frame 4/7]
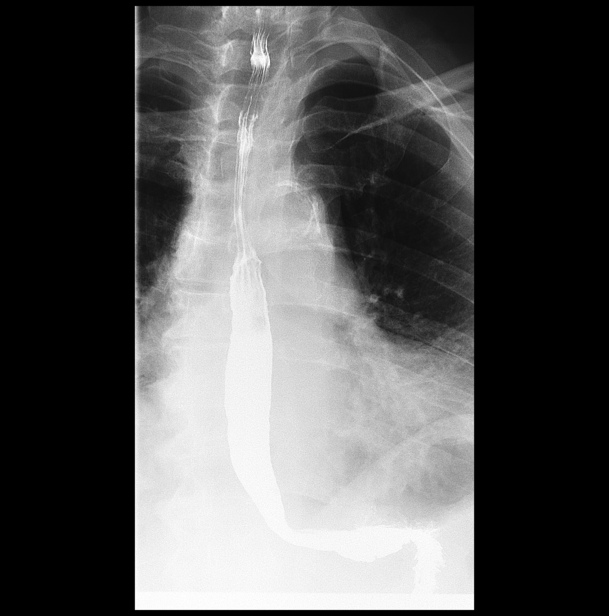

[Series 4: fluoro_barium 2fps_bw · 0.18mm/px · 2 of 3 frames shown (3 of 9)]
[frame 1/3]
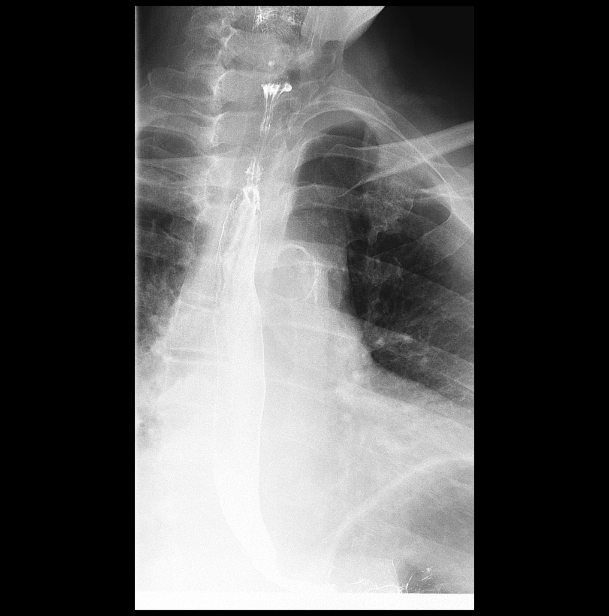
[frame 3/3]
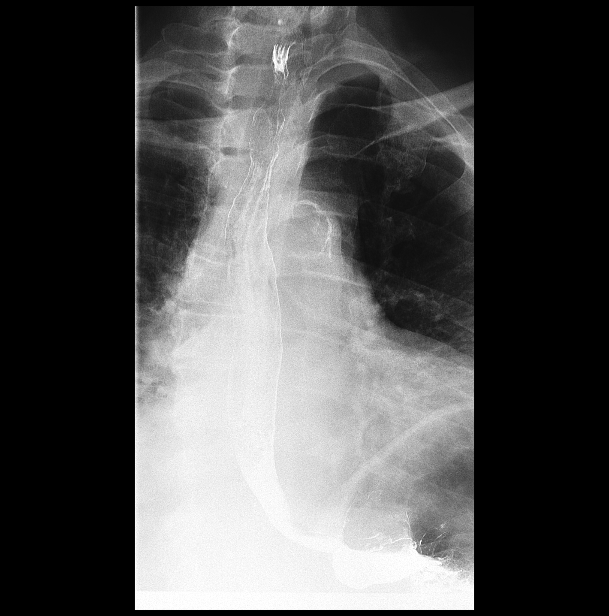

[Series 5: fluoro_barium 2fps_bw · 0.18mm/px · 1 of 1 slices shown (4 of 9)]
[im 1/1]
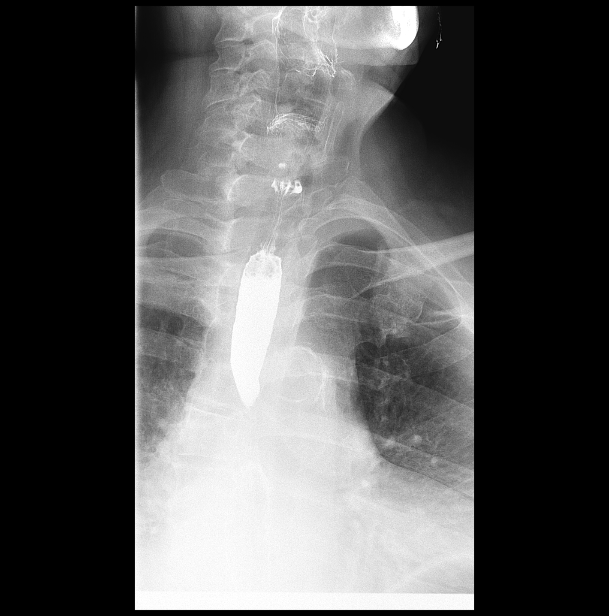

[Series 7: fluoro_barium 2fps_bw · 0.18mm/px · 1 of 1 slices shown (5 of 9)]
[im 1/1]
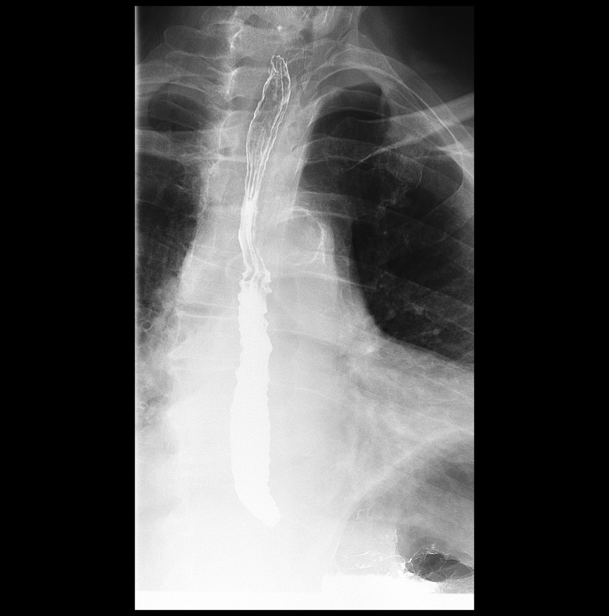

[Series 8: fluoro_barium 2fps_bw · 0.18mm/px · 1 of 1 slices shown (6 of 9)]
[im 1/1]
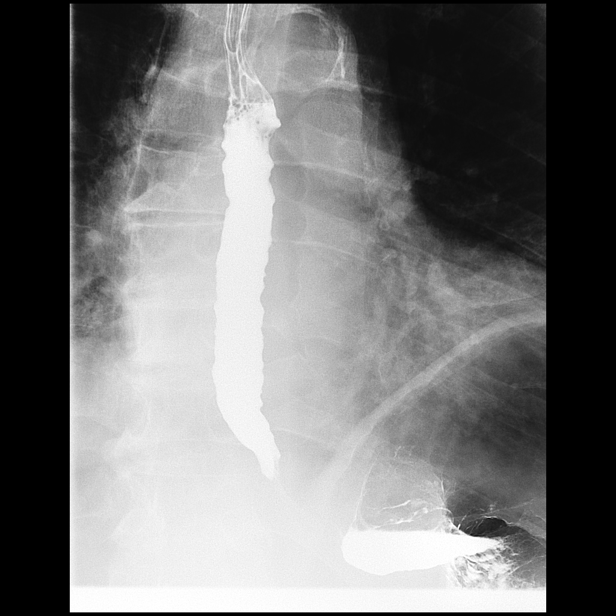

[Series 10: fluoro_barium 2fps_bw · 0.18mm/px · 1 of 1 slices shown (7 of 9)]
[im 1/1]
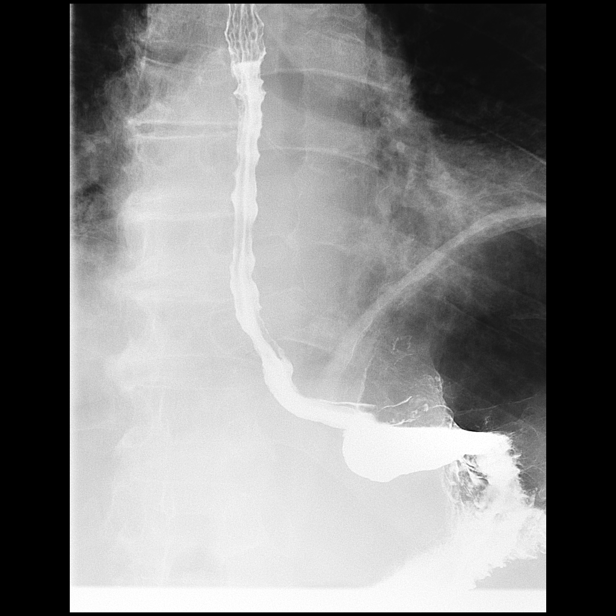

[Series 11: fluoro_barium 2fps_bw · 0.18mm/px · 1 of 1 slices shown (8 of 9)]
[im 1/1]
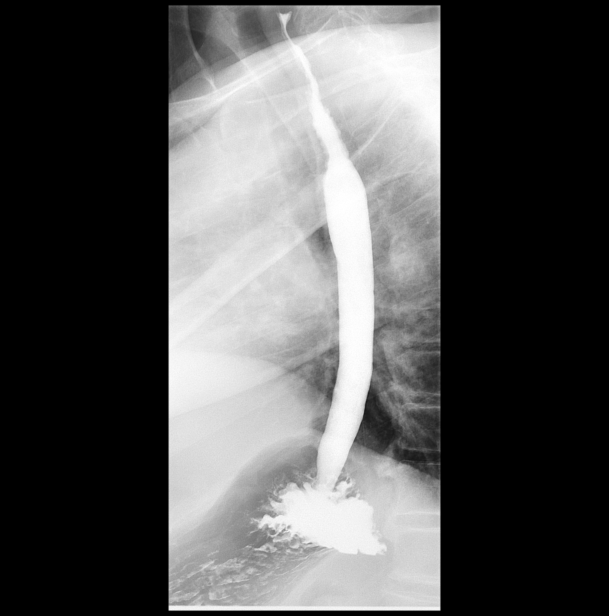

[Series 13: fluoro_barium 2fps_bw · 0.18mm/px · 1 of 1 slices shown (9 of 9)]
[im 1/1]
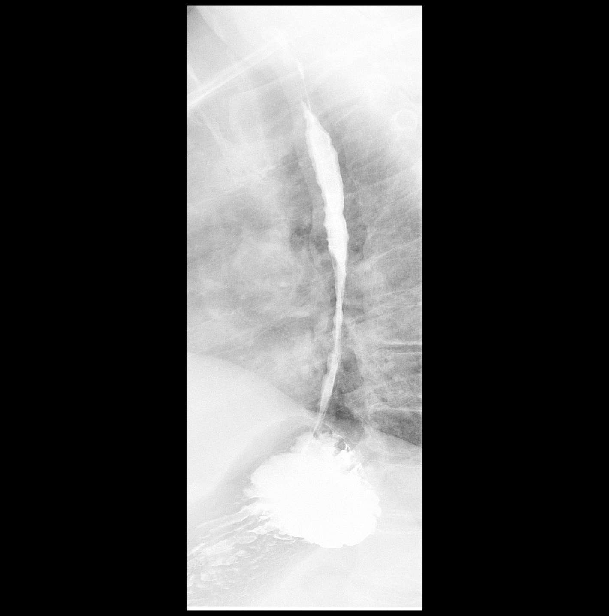

[Series 15: cp_standard · 0.27mm/px · 1 of 1 slices shown (1 of 3)]
[im 1/1]
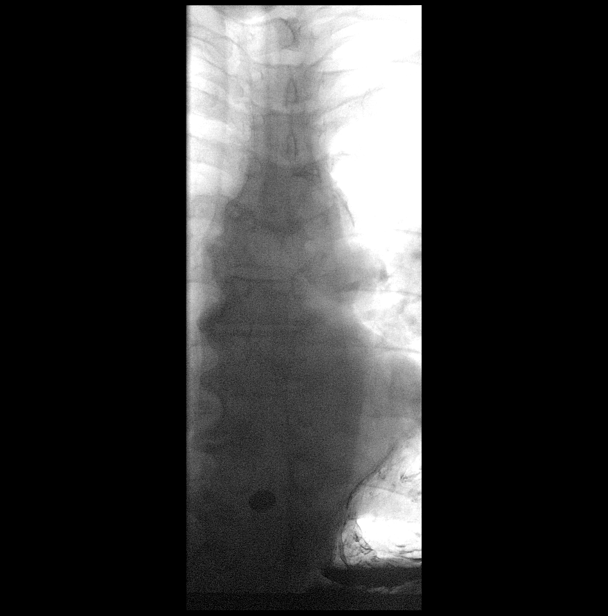

[Series 16: cp_standard · 0.27mm/px · 1 of 1 slices shown (2 of 3)]
[im 1/1]
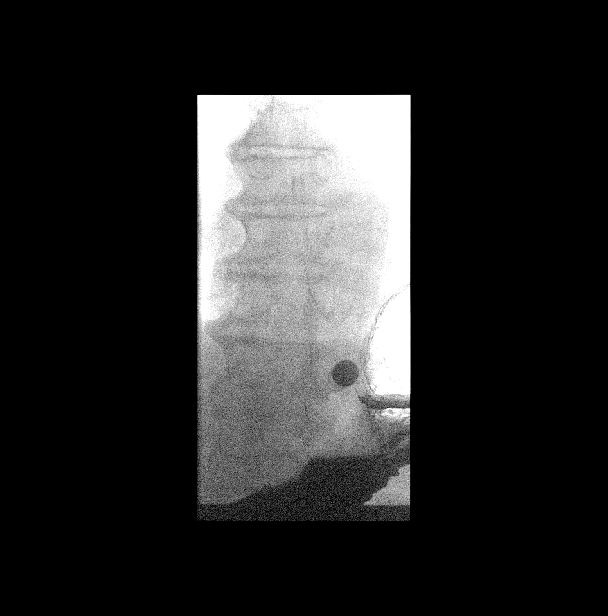

[Series 18: cp_standard · 0.27mm/px · 1 of 1 slices shown (3 of 3)]
[im 1/1]
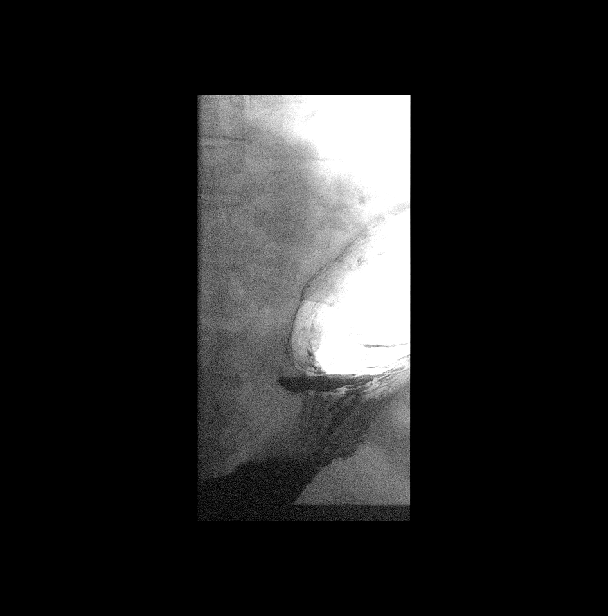

[14 of 22 positions shown; findings below may reference images not displayed]

FINDINGS: Decreased primary peristaltic wave with intermittent tertiary
contractions. Esophagus distends normally. No persisting stricture.
No discrete mass or ulceration. No spontaneous or elicited
gastroesophageal reflux. No hiatal hernia. At the conclusion of the
study, a 13 mm barium tablet was administered. This passed without
delay into the stomach.
IMPRESSION: Moderate esophageal dysmotility.  No persisting stricture.

## 2020-06-30 IMAGING — DX DG CHEST 1V PORT
1 series · 1 of 1 positions shown · non-contrast
Comparison: None.

CLINICAL DATA: Medial chest tightness.  Shortness of breath.

EXAM:
PORTABLE CHEST 1 VIEW

[chest ap]
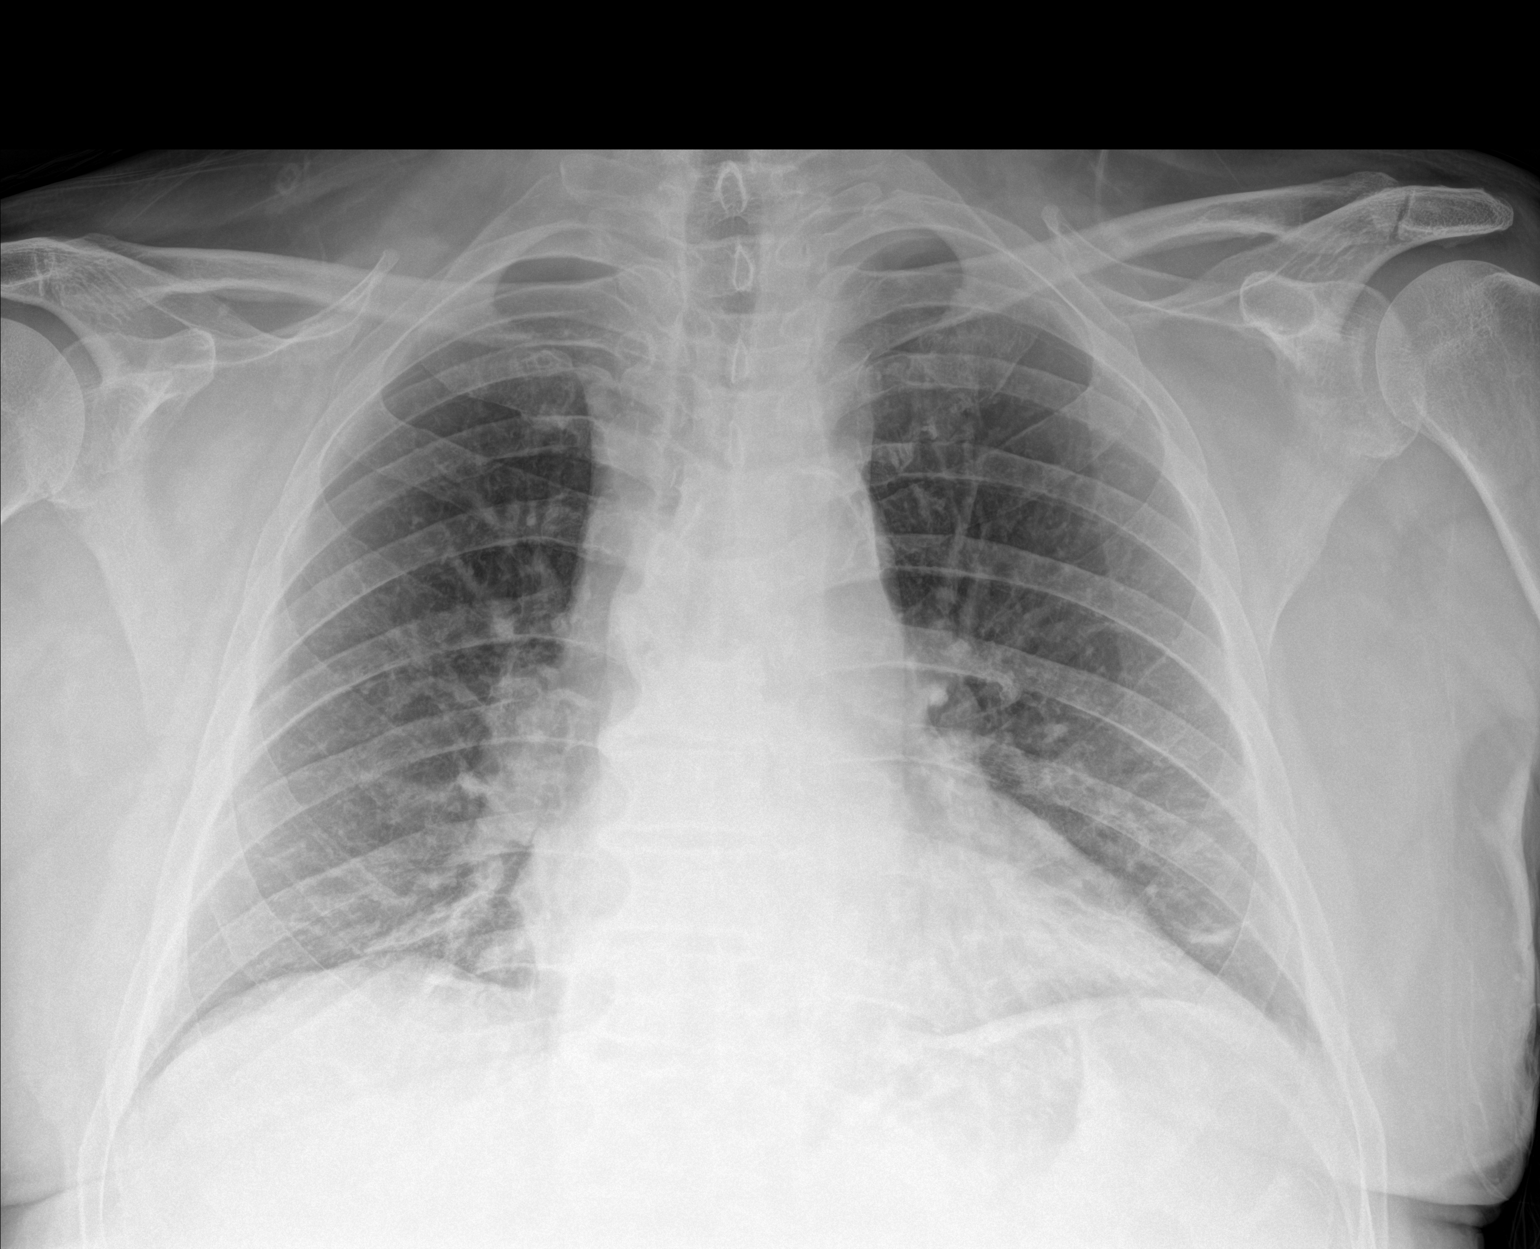

[1 of 1 positions shown; findings below may reference images not displayed]

FINDINGS: Mild atelectasis in the bases. The heart, hila, mediastinum, lungs,
and pleura are unremarkable.
IMPRESSION: No active disease.

## 2021-04-23 ENCOUNTER — Encounter: Payer: Self-pay | Admitting: General Surgery

## 2021-04-24 ENCOUNTER — Encounter: Payer: Self-pay | Admitting: General Surgery

## 2021-04-24 ENCOUNTER — Encounter
Admission: RE | Disposition: A | Discharge status: Home / Self Care | Payer: Self-pay | Source: Home / Self Care | Attending: General Surgery

## 2021-04-24 ENCOUNTER — Ambulatory Visit: Payer: Medicare Other | Admitting: Anesthesiology

## 2021-04-24 ENCOUNTER — Ambulatory Visit
Admission: RE | Admit: 2021-04-24 | Discharge: 2021-04-24 | Disposition: A | Discharge status: Home / Self Care | Payer: Medicare Other | Attending: General Surgery | Admitting: General Surgery

## 2021-04-24 DIAGNOSIS — N183 Chronic kidney disease, stage 3 unspecified: Secondary | ICD-10-CM | POA: Insufficient documentation

## 2021-04-24 DIAGNOSIS — K219 Gastro-esophageal reflux disease without esophagitis: Secondary | ICD-10-CM | POA: Insufficient documentation

## 2021-04-24 DIAGNOSIS — Z87891 Personal history of nicotine dependence: Secondary | ICD-10-CM | POA: Diagnosis not present

## 2021-04-24 DIAGNOSIS — R1310 Dysphagia, unspecified: Secondary | ICD-10-CM | POA: Diagnosis not present

## 2021-04-24 DIAGNOSIS — Z79899 Other long term (current) drug therapy: Secondary | ICD-10-CM | POA: Insufficient documentation

## 2021-04-24 DIAGNOSIS — K317 Polyp of stomach and duodenum: Secondary | ICD-10-CM | POA: Insufficient documentation

## 2021-04-24 DIAGNOSIS — Z885 Allergy status to narcotic agent status: Secondary | ICD-10-CM | POA: Diagnosis not present

## 2021-04-24 DIAGNOSIS — I129 Hypertensive chronic kidney disease with stage 1 through stage 4 chronic kidney disease, or unspecified chronic kidney disease: Secondary | ICD-10-CM | POA: Diagnosis not present

## 2021-04-24 DIAGNOSIS — Z7982 Long term (current) use of aspirin: Secondary | ICD-10-CM | POA: Diagnosis not present

## 2021-04-24 DIAGNOSIS — Z888 Allergy status to other drugs, medicaments and biological substances status: Secondary | ICD-10-CM | POA: Insufficient documentation

## 2021-04-24 HISTORY — PX: ESOPHAGOGASTRODUODENOSCOPY (EGD) WITH PROPOFOL: SHX5813

## 2021-04-24 HISTORY — DX: Duodenitis without bleeding: K29.80

## 2021-04-24 HISTORY — DX: Atherosclerotic heart disease of native coronary artery without angina pectoris: I25.10

## 2021-04-24 HISTORY — DX: Chronic kidney disease, unspecified: N18.9

## 2021-04-24 SURGERY — ESOPHAGOGASTRODUODENOSCOPY (EGD) WITH PROPOFOL
Anesthesia: General

## 2021-04-24 MED ORDER — PROPOFOL 10 MG/ML IV BOLUS
INTRAVENOUS | Status: DC | PRN
  Administered 2021-04-24: 60 mg via INTRAVENOUS

## 2021-04-24 MED ORDER — LIDOCAINE HCL (CARDIAC) PF 100 MG/5ML IV SOSY
PREFILLED_SYRINGE | INTRAVENOUS | Status: DC | PRN
  Administered 2021-04-24: 100 mg via INTRAVENOUS

## 2021-04-24 MED ORDER — PROPOFOL 500 MG/50ML IV EMUL
INTRAVENOUS | Status: DC | PRN
  Administered 2021-04-24: 160 ug/kg/min via INTRAVENOUS

## 2021-04-24 MED ORDER — SODIUM CHLORIDE 0.9 % IV SOLN: INTRAVENOUS | Status: DC

## 2021-04-24 NOTE — Anesthesia Postprocedure Evaluation (Signed)
Anesthesia Post Note  Patient: Christopher Melton  Procedure(s) Performed: ESOPHAGOGASTRODUODENOSCOPY (EGD) WITH PROPOFOL  Patient location during evaluation: Endoscopy Anesthesia Type: General Level of consciousness: awake and alert and oriented Pain management: pain level controlled Vital Signs Assessment: post-procedure vital signs reviewed and stable Respiratory status: spontaneous breathing, nonlabored ventilation and respiratory function stable Cardiovascular status: blood pressure returned to baseline and stable Postop Assessment: no signs of nausea or vomiting Anesthetic complications: no   No notable events documented.   Last Vitals:  Vitals:   04/24/21 0840 04/24/21 0850  BP: 138/73 140/72  Pulse: (!) 58 (!) 57  Resp: (!) 21 19  Temp:    SpO2: 96% 95%    Last Pain:  Vitals:   04/24/21 0810  TempSrc: Temporal  PainSc:                  [name]

## 2021-04-24 NOTE — Op Note (Signed)
[facility] Gastroenterology Patient Name: Christopher Melton Procedure Date: 04/24/2021 7:59 AM MRN: [id number] Account #: [id number] Date of Birth: Jul 27, 1934 Admit Type: Outpatient Age: 87 Room: [facility] ENDO ROOM 1 Gender: Male Note Status: Finalized Procedure:             Upper GI endoscopy Indications:           Dysphagia Providers:             Robert Bellow, MD Referring MD:          Ocie Cornfield. Ouida Sills MD, MD (Referring MD) Medicines:             Propofol per Anesthesia Complications:         No immediate complications. Procedure:             Pre-Anesthesia Assessment:                        - Prior to the procedure, a History and Physical was                         performed, and patient medications, allergies and                         sensitivities were reviewed. The patient's tolerance                         of previous anesthesia was reviewed.                        - The risks and benefits of the procedure and the                         sedation options and risks were discussed with the                         patient. All questions were answered and informed                         consent was obtained.                        After obtaining informed consent, the endoscope was                         passed under direct vision. Throughout the procedure,                         the patient's blood pressure, pulse, and oxygen                         saturations were monitored continuously. The Endoscope                         was introduced through the mouth, and advanced to the                         second part of duodenum. The upper GI endoscopy was  accomplished without difficulty. The patient tolerated                         the procedure well.                        The cricopharyngeous was passed without incidence. Findings:      The esophagus was normal.      No gross lesions were noted in the entire examined  stomach.      Multiple sessile polyps were found in the duodenal bulb. Impression:            - Normal esophagus.                        - No gross lesions in the stomach.                        - Duodenal polyposis.                        - No specimens collected. Recommendation:        - Return to GI office in 2 weeks. Procedure Code(s):     --- Professional ---                        [phone], Esophagogastroduodenoscopy, flexible,                         transoral; diagnostic, including collection of                         specimen(s) by brushing or washing, when performed                         (separate procedure) Diagnosis Code(s):     --- Professional ---                        K31.7, Polyp of stomach and duodenum                        R13.10, Dysphagia, unspecified CPT copyright 2019 [employer]. All rights reserved. The codes documented in this report are preliminary and upon coder review may  be revised to meet current compliance requirements. Robert Bellow, MD 04/24/2021 8:19:30 AM This report has been signed electronically. Number of Addenda: 0 Note Initiated On: 04/24/2021 7:59 AM Estimated Blood Loss:  Estimated blood loss: none.      [facility]

## 2021-04-24 NOTE — Anesthesia Procedure Notes (Signed)
Date/Time: 04/24/2021 8:05 AM Performed by: Demetrius Charity, CRNA Oxygen Delivery Method: Nasal cannula Induction Type: IV induction Placement Confirmation: CO2 detector and positive ETCO2

## 2021-04-24 NOTE — Transfer of Care (Signed)
Immediate Anesthesia Transfer of Care Note  Patient: Christopher Melton  Procedure(s) Performed: ESOPHAGOGASTRODUODENOSCOPY (EGD) WITH PROPOFOL  Patient Location: PACU  Anesthesia Type:General  Level of Consciousness: drowsy  Airway & Oxygen Therapy: Patient Spontanous Breathing  Post-op Assessment: Report given to RN and Post -op Vital signs reviewed and stable  Post vital signs: Reviewed and stable  Last Vitals:  Vitals Value Taken Time  BP 107/62   Temp    Pulse    Resp    SpO2 95     Last Pain:  Vitals:   04/24/21 0724  TempSrc: Temporal  PainSc: 0-No pain         Complications: No notable events documented.

## 2021-04-24 NOTE — Anesthesia Preprocedure Evaluation (Signed)
Anesthesia Evaluation  Patient identified by MRN, date of birth, ID band Patient awake    Reviewed: Allergy & Precautions, NPO status , Patient's Chart, lab work & pertinent test results  History of Anesthesia Complications Negative for: history of anesthetic complications  Airway Mallampati: III  TM Distance: >3 FB Neck ROM: Full    Dental no notable dental hx.    Pulmonary neg sleep apnea, neg COPD, former smoker,    breath sounds clear to auscultation- rhonchi (-) wheezing      Cardiovascular Exercise Tolerance: Good hypertension, Pt. on medications (-) CAD, (-) Past MI, (-) Cardiac Stents and (-) CABG  Rhythm:Regular Rate:Normal - Systolic murmurs and - Diastolic murmurs    Neuro/Psych neg Seizures negative neurological ROS  negative psych ROS   GI/Hepatic Neg liver ROS, GERD  ,  Endo/Other  negative endocrine ROSneg diabetes  Renal/GU Renal InsufficiencyRenal disease     Musculoskeletal  (+) Arthritis ,   Abdominal (+) - obese,   Peds  Hematology negative hematology ROS (+)   Anesthesia Other Findings Past Medical History: No date: BPH (benign prostatic hyperplasia) No date: Carotid stenosis No date: Chronic kidney disease     Comment:  stage 3 No date: Colon polyps No date: Coronary artery disease No date: Duodenitis No date: Erectile dysfunction No date: Familial tremor No date: GERD (gastroesophageal reflux disease) No date: Hyperlipidemia No date: Kidney stone No date: Osteoarthritis     Comment:  hands No date: Trigger finger   Reproductive/Obstetrics                             Anesthesia Physical Anesthesia Plan  ASA: 2  Anesthesia Plan: General   Post-op Pain Management:    Induction: Intravenous  PONV Risk Score and Plan: 1 and Propofol infusion  Airway Management Planned: Natural Airway  Additional Equipment:   Intra-op Plan:   Post-operative  Plan:   Informed Consent: I have reviewed the patients History and Physical, chart, labs and discussed the procedure including the risks, benefits and alternatives for the proposed anesthesia with the patient or authorized representative who has indicated his/her understanding and acceptance.     Dental advisory given  Plan Discussed with: CRNA and Anesthesiologist  Anesthesia Plan Comments:         Anesthesia Quick Evaluation

## 2021-04-24 NOTE — H&P (Signed)
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 Clears with liquids. Belching post meals. No significant heart burn. 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 Donnella Sham.   2020 barium swallow showed esophageal dysmotility without persistent stricture.   Medications Prior to Admission  Medication Sig Dispense Refill Last Dose   aspirin 81 MG tablet Take 81 mg by mouth daily.   04/23/2021   losartan-hydrochlorothiazide (HYZAAR) 100-12.5 MG tablet Take 1 tablet by mouth daily.   04/23/2021   Multiple Vitamin (MULTI VITAMIN DAILY PO) Take by mouth daily. doTerra lifelong vitality pack   Past Week   pantoprazole (PROTONIX) 40 MG tablet    04/23/2021   CANNABIDIOL PO Take by mouth. (hemp extract oil) (Patient not taking: Reported on 04/24/2021)   Not Taking   clotrimazole-betamethasone (LOTRISONE) cream APPLY TO AFFECTED AREA TWICE A DAY (Patient not taking: Reported on 04/24/2021)   Completed Course   ondansetron (ZOFRAN ODT) 4 MG disintegrating tablet Take 1 tablet (4 mg total) by mouth every 8 (eight) hours as needed for nausea or vomiting. 20 tablet 0    sildenafil (REVATIO) 20 MG tablet Take by mouth.      tamsulosin (FLOMAX) 0.4 MG CAPS capsule Take 1 capsule (0.4 mg total) by mouth daily. (Patient not taking: Reported on 04/24/2021) 30 capsule 6 Completed Course   Allergies  Allergen Reactions   Ace Inhibitors    Lovastatin     dizziness   Oxycodone Nausea And Vomiting   Past Medical History:  Diagnosis Date   BPH (benign prostatic hyperplasia)    Carotid stenosis    Chronic kidney disease    stage 3   Colon polyps    Coronary artery disease    Duodenitis    Erectile dysfunction    Familial tremor    GERD (gastroesophageal reflux disease)    Hyperlipidemia    Kidney stone    Osteoarthritis    hands   Trigger finger    Past Surgical History:  Procedure  Laterality Date   [place]   CATARACT EXTRACTION W/PHACO Left 05/19/2018   Procedure: CATARACT EXTRACTION PHACO AND INTRAOCULAR LENS PLACEMENT ([place]) LEFT;  Surgeon: Leandrew Koyanagi, MD;  Location: [place];  Service: Ophthalmology;  Laterality: Left;   CATARACT EXTRACTION W/PHACO Right 06/30/2018   Procedure: CATARACT EXTRACTION PHACO AND INTRAOCULAR LENS PLACEMENT ([place]) RIGHT;  Surgeon: Leandrew Koyanagi, MD;  Location: [place];  Service: Ophthalmology;  Laterality: Right;   COLONOSCOPY     COLONOSCOPY WITH PROPOFOL N/A 06/19/2015   Procedure: COLONOSCOPY WITH PROPOFOL;  Surgeon: Lollie Sails, MD;  [facility];  Service: Endoscopy;  Laterality: N/A;   ESOPHAGOGASTRODUODENOSCOPY (EGD) WITH PROPOFOL N/A 06/19/2015   Procedure: ESOPHAGOGASTRODUODENOSCOPY (EGD) WITH PROPOFOL;  Surgeon: Lollie Sails, MD;  Location: [facility] ENDOSCOPY;  Service: Endoscopy;  Laterality: N/A;   TRIGGER FINGER RELEASE Bilateral    Social History   Socioeconomic History   Marital status: Married    Spouse name: Not on file   Number of children: Not on file   Years of education: Not on file   Highest education level: Not on file  Occupational History   Not on file  Tobacco Use   Smoking status: Former    Types: Cigarettes    Quit date: 12/30/1976    Years since quitting: 44.3   Smokeless  tobacco: Never  Vaping Use   Vaping Use: Never used  Substance and Sexual Activity   Alcohol use: No   Drug use: No   Sexual activity: Yes    Birth control/protection: None  Other Topics Concern   Not on file  Social History Narrative   Not on file   Social Determinants of Health   Financial Resource Strain: Not on file  Food Insecurity: Not on file  Transportation Needs: Not on file  Physical Activity: Not on file  Stress: Not on file  Social Connections: Not on file  Intimate Partner Violence: Not on file   Social History   Social  History Narrative   Not on file     ROS: Negative.     PE: HEENT: Negative. Lungs: Clear. Cardio: RR.   Assessment/Plan:  Proceed with planned endoscopy.   Forest Gleason [facility] 04/24/2021

## 2021-04-25 ENCOUNTER — Encounter: Payer: Self-pay | Admitting: General Surgery

## 2022-02-12 DIAGNOSIS — Z0100 Encounter for examination of eyes and vision without abnormal findings: Secondary | ICD-10-CM | POA: Diagnosis not present

## 2022-04-25 DIAGNOSIS — I129 Hypertensive chronic kidney disease with stage 1 through stage 4 chronic kidney disease, or unspecified chronic kidney disease: Secondary | ICD-10-CM | POA: Diagnosis not present

## 2022-04-25 DIAGNOSIS — E7800 Pure hypercholesterolemia, unspecified: Secondary | ICD-10-CM | POA: Diagnosis not present

## 2022-04-25 DIAGNOSIS — R7309 Other abnormal glucose: Secondary | ICD-10-CM | POA: Diagnosis not present

## 2022-04-25 DIAGNOSIS — N183 Chronic kidney disease, stage 3 unspecified: Secondary | ICD-10-CM | POA: Diagnosis not present

## 2022-04-30 DIAGNOSIS — R7303 Prediabetes: Secondary | ICD-10-CM | POA: Diagnosis not present

## 2022-04-30 DIAGNOSIS — G8929 Other chronic pain: Secondary | ICD-10-CM | POA: Diagnosis not present

## 2022-04-30 DIAGNOSIS — N183 Chronic kidney disease, stage 3 unspecified: Secondary | ICD-10-CM | POA: Diagnosis not present

## 2022-04-30 DIAGNOSIS — T466X5A Adverse effect of antihyperlipidemic and antiarteriosclerotic drugs, initial encounter: Secondary | ICD-10-CM | POA: Diagnosis not present

## 2022-04-30 DIAGNOSIS — Z0000 Encounter for general adult medical examination without abnormal findings: Secondary | ICD-10-CM | POA: Diagnosis not present

## 2022-04-30 DIAGNOSIS — M25551 Pain in right hip: Secondary | ICD-10-CM | POA: Diagnosis not present

## 2022-04-30 DIAGNOSIS — M791 Myalgia, unspecified site: Secondary | ICD-10-CM | POA: Diagnosis not present

## 2022-04-30 DIAGNOSIS — I129 Hypertensive chronic kidney disease with stage 1 through stage 4 chronic kidney disease, or unspecified chronic kidney disease: Secondary | ICD-10-CM | POA: Diagnosis not present

## 2022-04-30 DIAGNOSIS — I779 Disorder of arteries and arterioles, unspecified: Secondary | ICD-10-CM | POA: Diagnosis not present

## 2022-05-20 DIAGNOSIS — M9905 Segmental and somatic dysfunction of pelvic region: Secondary | ICD-10-CM | POA: Diagnosis not present

## 2022-05-20 DIAGNOSIS — M5441 Lumbago with sciatica, right side: Secondary | ICD-10-CM | POA: Diagnosis not present

## 2022-05-20 DIAGNOSIS — M7918 Myalgia, other site: Secondary | ICD-10-CM | POA: Diagnosis not present

## 2022-05-20 DIAGNOSIS — G5791 Unspecified mononeuropathy of right lower limb: Secondary | ICD-10-CM | POA: Diagnosis not present

## 2022-05-20 DIAGNOSIS — M5137 Other intervertebral disc degeneration, lumbosacral region: Secondary | ICD-10-CM | POA: Diagnosis not present

## 2022-05-20 DIAGNOSIS — M9904 Segmental and somatic dysfunction of sacral region: Secondary | ICD-10-CM | POA: Diagnosis not present

## 2022-05-20 DIAGNOSIS — M9903 Segmental and somatic dysfunction of lumbar region: Secondary | ICD-10-CM | POA: Diagnosis not present

## 2022-05-23 DIAGNOSIS — M5136 Other intervertebral disc degeneration, lumbar region: Secondary | ICD-10-CM | POA: Diagnosis not present

## 2022-05-23 DIAGNOSIS — M9904 Segmental and somatic dysfunction of sacral region: Secondary | ICD-10-CM | POA: Diagnosis not present

## 2022-05-23 DIAGNOSIS — M5441 Lumbago with sciatica, right side: Secondary | ICD-10-CM | POA: Diagnosis not present

## 2022-05-23 DIAGNOSIS — M9905 Segmental and somatic dysfunction of pelvic region: Secondary | ICD-10-CM | POA: Diagnosis not present

## 2022-05-23 DIAGNOSIS — M5137 Other intervertebral disc degeneration, lumbosacral region: Secondary | ICD-10-CM | POA: Diagnosis not present

## 2022-05-23 DIAGNOSIS — M7918 Myalgia, other site: Secondary | ICD-10-CM | POA: Diagnosis not present

## 2022-05-23 DIAGNOSIS — M9903 Segmental and somatic dysfunction of lumbar region: Secondary | ICD-10-CM | POA: Diagnosis not present

## 2022-05-26 DIAGNOSIS — M9904 Segmental and somatic dysfunction of sacral region: Secondary | ICD-10-CM | POA: Diagnosis not present

## 2022-05-26 DIAGNOSIS — M9903 Segmental and somatic dysfunction of lumbar region: Secondary | ICD-10-CM | POA: Diagnosis not present

## 2022-05-26 DIAGNOSIS — M5441 Lumbago with sciatica, right side: Secondary | ICD-10-CM | POA: Diagnosis not present

## 2022-05-26 DIAGNOSIS — M5137 Other intervertebral disc degeneration, lumbosacral region: Secondary | ICD-10-CM | POA: Diagnosis not present

## 2022-05-26 DIAGNOSIS — M5136 Other intervertebral disc degeneration, lumbar region: Secondary | ICD-10-CM | POA: Diagnosis not present

## 2022-05-26 DIAGNOSIS — M9905 Segmental and somatic dysfunction of pelvic region: Secondary | ICD-10-CM | POA: Diagnosis not present

## 2022-05-26 DIAGNOSIS — M7918 Myalgia, other site: Secondary | ICD-10-CM | POA: Diagnosis not present

## 2022-05-27 DIAGNOSIS — M5136 Other intervertebral disc degeneration, lumbar region: Secondary | ICD-10-CM | POA: Diagnosis not present

## 2022-05-27 DIAGNOSIS — M7918 Myalgia, other site: Secondary | ICD-10-CM | POA: Diagnosis not present

## 2022-05-27 DIAGNOSIS — M9905 Segmental and somatic dysfunction of pelvic region: Secondary | ICD-10-CM | POA: Diagnosis not present

## 2022-05-27 DIAGNOSIS — M5137 Other intervertebral disc degeneration, lumbosacral region: Secondary | ICD-10-CM | POA: Diagnosis not present

## 2022-05-27 DIAGNOSIS — M5441 Lumbago with sciatica, right side: Secondary | ICD-10-CM | POA: Diagnosis not present

## 2022-05-27 DIAGNOSIS — M9904 Segmental and somatic dysfunction of sacral region: Secondary | ICD-10-CM | POA: Diagnosis not present

## 2022-05-27 DIAGNOSIS — M9903 Segmental and somatic dysfunction of lumbar region: Secondary | ICD-10-CM | POA: Diagnosis not present

## 2022-05-30 DIAGNOSIS — M5136 Other intervertebral disc degeneration, lumbar region: Secondary | ICD-10-CM | POA: Diagnosis not present

## 2022-05-30 DIAGNOSIS — M9903 Segmental and somatic dysfunction of lumbar region: Secondary | ICD-10-CM | POA: Diagnosis not present

## 2022-05-30 DIAGNOSIS — M5441 Lumbago with sciatica, right side: Secondary | ICD-10-CM | POA: Diagnosis not present

## 2022-05-30 DIAGNOSIS — M9904 Segmental and somatic dysfunction of sacral region: Secondary | ICD-10-CM | POA: Diagnosis not present

## 2022-05-30 DIAGNOSIS — M5137 Other intervertebral disc degeneration, lumbosacral region: Secondary | ICD-10-CM | POA: Diagnosis not present

## 2022-05-30 DIAGNOSIS — M9905 Segmental and somatic dysfunction of pelvic region: Secondary | ICD-10-CM | POA: Diagnosis not present

## 2022-05-30 DIAGNOSIS — M7918 Myalgia, other site: Secondary | ICD-10-CM | POA: Diagnosis not present

## 2022-06-02 DIAGNOSIS — M5136 Other intervertebral disc degeneration, lumbar region: Secondary | ICD-10-CM | POA: Diagnosis not present

## 2022-06-02 DIAGNOSIS — M9904 Segmental and somatic dysfunction of sacral region: Secondary | ICD-10-CM | POA: Diagnosis not present

## 2022-06-02 DIAGNOSIS — M9905 Segmental and somatic dysfunction of pelvic region: Secondary | ICD-10-CM | POA: Diagnosis not present

## 2022-06-02 DIAGNOSIS — M5441 Lumbago with sciatica, right side: Secondary | ICD-10-CM | POA: Diagnosis not present

## 2022-06-02 DIAGNOSIS — M5137 Other intervertebral disc degeneration, lumbosacral region: Secondary | ICD-10-CM | POA: Diagnosis not present

## 2022-06-02 DIAGNOSIS — M9903 Segmental and somatic dysfunction of lumbar region: Secondary | ICD-10-CM | POA: Diagnosis not present

## 2022-06-02 DIAGNOSIS — M7918 Myalgia, other site: Secondary | ICD-10-CM | POA: Diagnosis not present

## 2022-06-04 DIAGNOSIS — M7918 Myalgia, other site: Secondary | ICD-10-CM | POA: Diagnosis not present

## 2022-06-04 DIAGNOSIS — M5136 Other intervertebral disc degeneration, lumbar region: Secondary | ICD-10-CM | POA: Diagnosis not present

## 2022-06-04 DIAGNOSIS — M9903 Segmental and somatic dysfunction of lumbar region: Secondary | ICD-10-CM | POA: Diagnosis not present

## 2022-06-04 DIAGNOSIS — M5137 Other intervertebral disc degeneration, lumbosacral region: Secondary | ICD-10-CM | POA: Diagnosis not present

## 2022-06-04 DIAGNOSIS — M9905 Segmental and somatic dysfunction of pelvic region: Secondary | ICD-10-CM | POA: Diagnosis not present

## 2022-06-04 DIAGNOSIS — M5441 Lumbago with sciatica, right side: Secondary | ICD-10-CM | POA: Diagnosis not present

## 2022-06-04 DIAGNOSIS — M9904 Segmental and somatic dysfunction of sacral region: Secondary | ICD-10-CM | POA: Diagnosis not present

## 2022-06-09 DIAGNOSIS — M5137 Other intervertebral disc degeneration, lumbosacral region: Secondary | ICD-10-CM | POA: Diagnosis not present

## 2022-06-09 DIAGNOSIS — M9904 Segmental and somatic dysfunction of sacral region: Secondary | ICD-10-CM | POA: Diagnosis not present

## 2022-06-09 DIAGNOSIS — M9903 Segmental and somatic dysfunction of lumbar region: Secondary | ICD-10-CM | POA: Diagnosis not present

## 2022-06-09 DIAGNOSIS — M5441 Lumbago with sciatica, right side: Secondary | ICD-10-CM | POA: Diagnosis not present

## 2022-06-09 DIAGNOSIS — M5136 Other intervertebral disc degeneration, lumbar region: Secondary | ICD-10-CM | POA: Diagnosis not present

## 2022-06-09 DIAGNOSIS — M7918 Myalgia, other site: Secondary | ICD-10-CM | POA: Diagnosis not present

## 2022-06-09 DIAGNOSIS — M9905 Segmental and somatic dysfunction of pelvic region: Secondary | ICD-10-CM | POA: Diagnosis not present

## 2022-06-10 DIAGNOSIS — F17211 Nicotine dependence, cigarettes, in remission: Secondary | ICD-10-CM | POA: Diagnosis not present

## 2022-06-10 DIAGNOSIS — Z008 Encounter for other general examination: Secondary | ICD-10-CM | POA: Diagnosis not present

## 2022-06-10 DIAGNOSIS — I10 Essential (primary) hypertension: Secondary | ICD-10-CM | POA: Diagnosis not present

## 2022-06-10 DIAGNOSIS — R7303 Prediabetes: Secondary | ICD-10-CM | POA: Diagnosis not present

## 2022-06-10 DIAGNOSIS — K219 Gastro-esophageal reflux disease without esophagitis: Secondary | ICD-10-CM | POA: Diagnosis not present

## 2022-06-10 DIAGNOSIS — Z6828 Body mass index (BMI) 28.0-28.9, adult: Secondary | ICD-10-CM | POA: Diagnosis not present

## 2022-06-10 DIAGNOSIS — E663 Overweight: Secondary | ICD-10-CM | POA: Diagnosis not present

## 2022-06-11 DIAGNOSIS — M9904 Segmental and somatic dysfunction of sacral region: Secondary | ICD-10-CM | POA: Diagnosis not present

## 2022-06-11 DIAGNOSIS — M9903 Segmental and somatic dysfunction of lumbar region: Secondary | ICD-10-CM | POA: Diagnosis not present

## 2022-06-11 DIAGNOSIS — M9905 Segmental and somatic dysfunction of pelvic region: Secondary | ICD-10-CM | POA: Diagnosis not present

## 2022-06-11 DIAGNOSIS — M7918 Myalgia, other site: Secondary | ICD-10-CM | POA: Diagnosis not present

## 2022-06-11 DIAGNOSIS — M5136 Other intervertebral disc degeneration, lumbar region: Secondary | ICD-10-CM | POA: Diagnosis not present

## 2022-06-11 DIAGNOSIS — M5441 Lumbago with sciatica, right side: Secondary | ICD-10-CM | POA: Diagnosis not present

## 2022-06-11 DIAGNOSIS — M5137 Other intervertebral disc degeneration, lumbosacral region: Secondary | ICD-10-CM | POA: Diagnosis not present

## 2022-06-13 DIAGNOSIS — M9905 Segmental and somatic dysfunction of pelvic region: Secondary | ICD-10-CM | POA: Diagnosis not present

## 2022-06-13 DIAGNOSIS — M5441 Lumbago with sciatica, right side: Secondary | ICD-10-CM | POA: Diagnosis not present

## 2022-06-13 DIAGNOSIS — M9904 Segmental and somatic dysfunction of sacral region: Secondary | ICD-10-CM | POA: Diagnosis not present

## 2022-06-13 DIAGNOSIS — M9903 Segmental and somatic dysfunction of lumbar region: Secondary | ICD-10-CM | POA: Diagnosis not present

## 2022-06-13 DIAGNOSIS — M7918 Myalgia, other site: Secondary | ICD-10-CM | POA: Diagnosis not present

## 2022-06-13 DIAGNOSIS — M5136 Other intervertebral disc degeneration, lumbar region: Secondary | ICD-10-CM | POA: Diagnosis not present

## 2022-06-13 DIAGNOSIS — M5137 Other intervertebral disc degeneration, lumbosacral region: Secondary | ICD-10-CM | POA: Diagnosis not present

## 2022-06-16 DIAGNOSIS — M7918 Myalgia, other site: Secondary | ICD-10-CM | POA: Diagnosis not present

## 2022-06-16 DIAGNOSIS — M5441 Lumbago with sciatica, right side: Secondary | ICD-10-CM | POA: Diagnosis not present

## 2022-06-16 DIAGNOSIS — M9903 Segmental and somatic dysfunction of lumbar region: Secondary | ICD-10-CM | POA: Diagnosis not present

## 2022-06-16 DIAGNOSIS — M9904 Segmental and somatic dysfunction of sacral region: Secondary | ICD-10-CM | POA: Diagnosis not present

## 2022-06-16 DIAGNOSIS — M5137 Other intervertebral disc degeneration, lumbosacral region: Secondary | ICD-10-CM | POA: Diagnosis not present

## 2022-06-16 DIAGNOSIS — M9905 Segmental and somatic dysfunction of pelvic region: Secondary | ICD-10-CM | POA: Diagnosis not present

## 2022-06-16 DIAGNOSIS — M5136 Other intervertebral disc degeneration, lumbar region: Secondary | ICD-10-CM | POA: Diagnosis not present

## 2022-06-18 DIAGNOSIS — M5136 Other intervertebral disc degeneration, lumbar region: Secondary | ICD-10-CM | POA: Diagnosis not present

## 2022-06-18 DIAGNOSIS — M5137 Other intervertebral disc degeneration, lumbosacral region: Secondary | ICD-10-CM | POA: Diagnosis not present

## 2022-06-18 DIAGNOSIS — M9904 Segmental and somatic dysfunction of sacral region: Secondary | ICD-10-CM | POA: Diagnosis not present

## 2022-06-18 DIAGNOSIS — M5441 Lumbago with sciatica, right side: Secondary | ICD-10-CM | POA: Diagnosis not present

## 2022-06-18 DIAGNOSIS — M7918 Myalgia, other site: Secondary | ICD-10-CM | POA: Diagnosis not present

## 2022-06-18 DIAGNOSIS — M9905 Segmental and somatic dysfunction of pelvic region: Secondary | ICD-10-CM | POA: Diagnosis not present

## 2022-06-18 DIAGNOSIS — M9903 Segmental and somatic dysfunction of lumbar region: Secondary | ICD-10-CM | POA: Diagnosis not present

## 2022-06-20 DIAGNOSIS — M7918 Myalgia, other site: Secondary | ICD-10-CM | POA: Diagnosis not present

## 2022-06-20 DIAGNOSIS — M9905 Segmental and somatic dysfunction of pelvic region: Secondary | ICD-10-CM | POA: Diagnosis not present

## 2022-06-20 DIAGNOSIS — M5441 Lumbago with sciatica, right side: Secondary | ICD-10-CM | POA: Diagnosis not present

## 2022-06-20 DIAGNOSIS — M5136 Other intervertebral disc degeneration, lumbar region: Secondary | ICD-10-CM | POA: Diagnosis not present

## 2022-06-20 DIAGNOSIS — M9903 Segmental and somatic dysfunction of lumbar region: Secondary | ICD-10-CM | POA: Diagnosis not present

## 2022-06-20 DIAGNOSIS — M9904 Segmental and somatic dysfunction of sacral region: Secondary | ICD-10-CM | POA: Diagnosis not present

## 2022-06-20 DIAGNOSIS — M5137 Other intervertebral disc degeneration, lumbosacral region: Secondary | ICD-10-CM | POA: Diagnosis not present

## 2022-06-24 DIAGNOSIS — Z872 Personal history of diseases of the skin and subcutaneous tissue: Secondary | ICD-10-CM | POA: Diagnosis not present

## 2022-06-24 DIAGNOSIS — Z86018 Personal history of other benign neoplasm: Secondary | ICD-10-CM | POA: Diagnosis not present

## 2022-06-24 DIAGNOSIS — L578 Other skin changes due to chronic exposure to nonionizing radiation: Secondary | ICD-10-CM | POA: Diagnosis not present

## 2022-06-24 DIAGNOSIS — L570 Actinic keratosis: Secondary | ICD-10-CM | POA: Diagnosis not present

## 2022-10-28 DIAGNOSIS — R7309 Other abnormal glucose: Secondary | ICD-10-CM | POA: Diagnosis not present

## 2022-10-28 DIAGNOSIS — I129 Hypertensive chronic kidney disease with stage 1 through stage 4 chronic kidney disease, or unspecified chronic kidney disease: Secondary | ICD-10-CM | POA: Diagnosis not present

## 2022-10-28 DIAGNOSIS — I779 Disorder of arteries and arterioles, unspecified: Secondary | ICD-10-CM | POA: Diagnosis not present

## 2022-10-28 DIAGNOSIS — N183 Chronic kidney disease, stage 3 unspecified: Secondary | ICD-10-CM | POA: Diagnosis not present

## 2022-11-04 DIAGNOSIS — R7303 Prediabetes: Secondary | ICD-10-CM | POA: Diagnosis not present

## 2022-11-04 DIAGNOSIS — M791 Myalgia, unspecified site: Secondary | ICD-10-CM | POA: Diagnosis not present

## 2022-11-04 DIAGNOSIS — N183 Chronic kidney disease, stage 3 unspecified: Secondary | ICD-10-CM | POA: Diagnosis not present

## 2022-11-04 DIAGNOSIS — I779 Disorder of arteries and arterioles, unspecified: Secondary | ICD-10-CM | POA: Diagnosis not present

## 2022-11-04 DIAGNOSIS — E7800 Pure hypercholesterolemia, unspecified: Secondary | ICD-10-CM | POA: Diagnosis not present

## 2022-11-04 DIAGNOSIS — T466X5A Adverse effect of antihyperlipidemic and antiarteriosclerotic drugs, initial encounter: Secondary | ICD-10-CM | POA: Diagnosis not present

## 2022-11-04 DIAGNOSIS — I129 Hypertensive chronic kidney disease with stage 1 through stage 4 chronic kidney disease, or unspecified chronic kidney disease: Secondary | ICD-10-CM | POA: Diagnosis not present

## 2023-01-06 DIAGNOSIS — H43813 Vitreous degeneration, bilateral: Secondary | ICD-10-CM | POA: Diagnosis not present

## 2023-01-06 DIAGNOSIS — Z961 Presence of intraocular lens: Secondary | ICD-10-CM | POA: Diagnosis not present

## 2023-01-06 DIAGNOSIS — H353131 Nonexudative age-related macular degeneration, bilateral, early dry stage: Secondary | ICD-10-CM | POA: Diagnosis not present

## 2023-01-06 DIAGNOSIS — H35373 Puckering of macula, bilateral: Secondary | ICD-10-CM | POA: Diagnosis not present

## 2023-01-13 DIAGNOSIS — Z0100 Encounter for examination of eyes and vision without abnormal findings: Secondary | ICD-10-CM | POA: Diagnosis not present

## 2023-04-28 DIAGNOSIS — R7303 Prediabetes: Secondary | ICD-10-CM | POA: Diagnosis not present

## 2023-04-28 DIAGNOSIS — E7800 Pure hypercholesterolemia, unspecified: Secondary | ICD-10-CM | POA: Diagnosis not present

## 2023-04-28 DIAGNOSIS — N183 Chronic kidney disease, stage 3 unspecified: Secondary | ICD-10-CM | POA: Diagnosis not present

## 2023-04-28 DIAGNOSIS — I129 Hypertensive chronic kidney disease with stage 1 through stage 4 chronic kidney disease, or unspecified chronic kidney disease: Secondary | ICD-10-CM | POA: Diagnosis not present

## 2023-05-05 DIAGNOSIS — Z0000 Encounter for general adult medical examination without abnormal findings: Secondary | ICD-10-CM | POA: Diagnosis not present

## 2023-05-05 DIAGNOSIS — N183 Chronic kidney disease, stage 3 unspecified: Secondary | ICD-10-CM | POA: Diagnosis not present

## 2023-05-05 DIAGNOSIS — T466X5A Adverse effect of antihyperlipidemic and antiarteriosclerotic drugs, initial encounter: Secondary | ICD-10-CM | POA: Diagnosis not present

## 2023-05-05 DIAGNOSIS — I129 Hypertensive chronic kidney disease with stage 1 through stage 4 chronic kidney disease, or unspecified chronic kidney disease: Secondary | ICD-10-CM | POA: Diagnosis not present

## 2023-05-05 DIAGNOSIS — R7303 Prediabetes: Secondary | ICD-10-CM | POA: Diagnosis not present

## 2023-05-05 DIAGNOSIS — I779 Disorder of arteries and arterioles, unspecified: Secondary | ICD-10-CM | POA: Diagnosis not present

## 2023-05-05 DIAGNOSIS — M791 Myalgia, unspecified site: Secondary | ICD-10-CM | POA: Diagnosis not present

## 2023-06-04 DIAGNOSIS — K219 Gastro-esophageal reflux disease without esophagitis: Secondary | ICD-10-CM | POA: Diagnosis not present

## 2023-06-04 DIAGNOSIS — E663 Overweight: Secondary | ICD-10-CM | POA: Diagnosis not present

## 2023-06-04 DIAGNOSIS — Z008 Encounter for other general examination: Secondary | ICD-10-CM | POA: Diagnosis not present

## 2023-06-04 DIAGNOSIS — Z6829 Body mass index (BMI) 29.0-29.9, adult: Secondary | ICD-10-CM | POA: Diagnosis not present

## 2023-06-30 DIAGNOSIS — L570 Actinic keratosis: Secondary | ICD-10-CM | POA: Diagnosis not present

## 2023-06-30 DIAGNOSIS — Z872 Personal history of diseases of the skin and subcutaneous tissue: Secondary | ICD-10-CM | POA: Diagnosis not present

## 2023-06-30 DIAGNOSIS — L578 Other skin changes due to chronic exposure to nonionizing radiation: Secondary | ICD-10-CM | POA: Diagnosis not present

## 2023-06-30 DIAGNOSIS — Z86018 Personal history of other benign neoplasm: Secondary | ICD-10-CM | POA: Diagnosis not present

## 2023-06-30 DIAGNOSIS — L821 Other seborrheic keratosis: Secondary | ICD-10-CM | POA: Diagnosis not present

## 2023-07-07 DIAGNOSIS — M5441 Lumbago with sciatica, right side: Secondary | ICD-10-CM | POA: Diagnosis not present

## 2023-07-07 DIAGNOSIS — M9903 Segmental and somatic dysfunction of lumbar region: Secondary | ICD-10-CM | POA: Diagnosis not present

## 2023-07-07 DIAGNOSIS — M5136 Other intervertebral disc degeneration, lumbar region: Secondary | ICD-10-CM | POA: Diagnosis not present

## 2023-07-07 DIAGNOSIS — M7918 Myalgia, other site: Secondary | ICD-10-CM | POA: Diagnosis not present

## 2023-07-07 DIAGNOSIS — M9904 Segmental and somatic dysfunction of sacral region: Secondary | ICD-10-CM | POA: Diagnosis not present

## 2023-07-07 DIAGNOSIS — M5137 Other intervertebral disc degeneration, lumbosacral region: Secondary | ICD-10-CM | POA: Diagnosis not present

## 2023-07-07 DIAGNOSIS — M9905 Segmental and somatic dysfunction of pelvic region: Secondary | ICD-10-CM | POA: Diagnosis not present

## 2023-07-20 DIAGNOSIS — L570 Actinic keratosis: Secondary | ICD-10-CM | POA: Diagnosis not present

## 2023-07-20 DIAGNOSIS — L821 Other seborrheic keratosis: Secondary | ICD-10-CM | POA: Diagnosis not present

## 2023-10-02 ENCOUNTER — Encounter: Payer: Self-pay | Admitting: Urology

## 2023-10-02 ENCOUNTER — Ambulatory Visit (INDEPENDENT_AMBULATORY_CARE_PROVIDER_SITE_OTHER): Payer: No Typology Code available for payment source | Admitting: Urology

## 2023-10-02 VITALS — BP 165/75 | HR 80 | Ht 70.0 in | Wt 198.0 lb

## 2023-10-02 DIAGNOSIS — N5201 Erectile dysfunction due to arterial insufficiency: Secondary | ICD-10-CM

## 2023-10-02 DIAGNOSIS — N529 Male erectile dysfunction, unspecified: Secondary | ICD-10-CM

## 2023-10-02 MED ORDER — AMBULATORY NON FORMULARY MEDICATION: 0 refills | Status: AC

## 2023-10-02 NOTE — Progress Notes (Signed)
I, Christopher Melton, acting as a scribe for Riki Altes, MD., have documented all relevant documentation on the behalf of Riki Altes, MD, as directed by Riki Altes, MD while in the presence of Riki Altes, MD.  10/02/2023 1:10 PM   Christopher Melton [birth year] [id number]  Referring provider: Lauro Regulus, MD [address] [facility] [place] [address] [place],  [place] [address]  Chief Complaint  Patient presents with   Erectile Dysfunction    HPI: Christopher Melton is a 89 y.o. male referred for evaluation of erectile dysfunction.   His wife passed away approximately 2 years ago. He last attempted intercourse several months before she died and he was having a difficulty achieving and maintaining an erection at that time. He had tried sildenafil up to 80 mg, which was not effective.  He is currently in a new relationship and his partner is 30 years younger and is interested in pursuing sexual activity. He has a friend who does intracavernosal injections and was interested in this treatment. I saw him in 2020 for a history of stone disease and BPH. Organic risk factors include hypertension, antihypertensive medications, carotid artery disease, hyperlipidemia, and prior tobacco use.   PMH: Past Medical History:  Diagnosis Date   BPH (benign prostatic hyperplasia)    Carotid stenosis    Chronic kidney disease    stage 3   Colon polyps    Coronary artery disease    Duodenitis    Erectile dysfunction    Familial tremor    GERD (gastroesophageal reflux disease)    Hyperlipidemia    Kidney stone    Osteoarthritis    hands   Trigger finger     Surgical History: Past Surgical History:  Procedure Laterality Date   CARDIAC CATHETERIZATION  1998   [place]   CATARACT EXTRACTION W/PHACO Left 05/19/2018   Procedure: CATARACT EXTRACTION PHACO AND INTRAOCULAR LENS PLACEMENT ([facility]) LEFT;  Surgeon: Lockie Mola, MD;  [facility];  Service:  Ophthalmology;  Laterality: Left;   CATARACT EXTRACTION W/PHACO Right 06/30/2018   Procedure: CATARACT EXTRACTION PHACO AND INTRAOCULAR LENS PLACEMENT ([facility]) RIGHT;  Surgeon: Lockie Mola, MD;  Location: [facility];  Service: Ophthalmology;  Laterality: Right;   COLONOSCOPY     COLONOSCOPY WITH PROPOFOL N/A 06/19/2015   Procedure: COLONOSCOPY WITH PROPOFOL;  Surgeon: Christena Deem, MD;  Location: [facility] ENDOSCOPY;  Service: Endoscopy;  Laterality: N/A;   ESOPHAGOGASTRODUODENOSCOPY (EGD) WITH PROPOFOL N/A 06/19/2015   Procedure: ESOPHAGOGASTRODUODENOSCOPY (EGD) WITH PROPOFOL;  Surgeon: Christena Deem, MD;  Location: [facility] ENDOSCOPY;  Service: Endoscopy;  Laterality: N/A;   ESOPHAGOGASTRODUODENOSCOPY (EGD) WITH PROPOFOL N/A 04/24/2021   Procedure: ESOPHAGOGASTRODUODENOSCOPY (EGD) WITH PROPOFOL;  Surgeon: Earline Mayotte, MD;  Location: [facility] ENDOSCOPY;  Service: Endoscopy;  Laterality: N/A;   TRIGGER FINGER RELEASE Bilateral     Home Medications:  Allergies as of 10/02/2023       Reactions   Ace Inhibitors    Lovastatin    dizziness   Oxycodone Nausea And Vomiting        Medication List        Accurate as of October 02, 2023  1:10 PM. If you have any questions, ask your nurse or doctor.          STOP taking these medications    CANNABIDIOL PO Stopped by: Riki Altes   clotrimazole-betamethasone cream Commonly known as: LOTRISONE Stopped by: Riki Altes  tamsulosin 0.4 MG Caps capsule Commonly known as: FLOMAX Stopped by: Riki Altes       TAKE these medications    AMBULATORY NON FORMULARY MEDICATION Trimix (30/1/10)-(Pap/Phent/PGE)  Test Dose  1ml vial   Qty #3 Refills 0  [facility] [phone] Fax [phone] Started by: Riki Altes   aspirin 81 MG tablet Take 81 mg by mouth daily.   losartan-hydrochlorothiazide 100-12.5 MG tablet Commonly known as: HYZAAR Take 1 tablet by mouth daily.   MULTI  VITAMIN DAILY PO Take by mouth daily. doTerra lifelong vitality pack   ondansetron 4 MG disintegrating tablet Commonly known as: Zofran ODT Take 1 tablet (4 mg total) by mouth every 8 (eight) hours as needed for nausea or vomiting.   pantoprazole 40 MG tablet Commonly known as: PROTONIX   sildenafil 20 MG tablet Commonly known as: REVATIO Take by mouth.        Allergies:  Allergies  Allergen Reactions   Ace Inhibitors    Lovastatin     dizziness   Oxycodone Nausea And Vomiting    Social History:  reports that he quit smoking about 46 years ago. His smoking use included cigarettes. He has never used smokeless tobacco. He reports that he does not drink alcohol and does not use drugs.   Physical Exam: BP (!) 165/75   Pulse 80   Ht 5\' 10"  (1.778 m)   Wt 198 lb (89.8 kg)   BMI 28.41 kg/m   Constitutional:  Alert and oriented, No acute distress. HEENT: [place] AT Respiratory: Normal respiratory effort, no increased work of breathing. Psychiatric: Normal mood and affect.   Assessment & Plan:    1. Erectile dysfunction PDE5 inhibitor refractory.  We discussed that all PDE5 inhibitors efficacies are similar, however some patients find better efficacy with one versus the other. Offered him a trial of tadalafil, however he was more interested in pursuing intracavernosal injections.  We discussed intracavernosal injections and potential complications of priapism and rarely a corporal scarring.  He was interested in pursuing and Rx of Trimix was sent to [facility].  PA appointment for injection training was scheduled and he was instructed to bring the Trimix to that appointment.   I have reviewed the above documentation for accuracy and completeness, and I agree with the above.   Riki Altes, MD  [facility] [address] [place], [place] [address] [phone]

## 2023-10-12 ENCOUNTER — Telehealth: Payer: Self-pay | Admitting: Urology

## 2023-10-12 NOTE — Telephone Encounter (Signed)
Left message letting patient know that I have re faxed Trimix RX to [facility] again and to let us know if there are any issues with this still

## 2023-10-12 NOTE — Telephone Encounter (Signed)
He states he was seen on the 1/17 and Trimix was called into [facility] and when he called them, they do not have it.

## 2023-10-20 NOTE — Progress Notes (Signed)
   10/22/2023 8:51 AM  Christopher Melton Jan 27, 1934 [id number]   Referring provider: Lenon Layman ORN, MD [address] [facility] [place],  [place] [address]  Urological history: 1.  Erectile dysfunction -Contributing factors of age, CKD, CAD, BPH, hyperlipidemia and former smoker -failed PDE5i's  2. BPH  3. Nephrolithiasis  Chief Complaint  Patient presents with   Follow-up   Erectile Dysfunction    Trimix   HPI: Christopher Melton is a 89 y.o. male who presents today for ICI titration.    Previous records reviewed.     The procedure is discussed with patient.  He is allowed to ask questions.  Questions were answered to his satisfaction.  He did not take his blood pressure medication this morning, so we were not able to proceed with titration  Physical Exam:  BP (!) 176/79   Pulse 67   Wt 189 lb (85.7 kg)   BMI 27.12 kg/m   Constitutional:  Well nourished. Alert and oriented, No acute distress.   Assessment & Plan:    1.  Erectile dysfunction -Return next week for ICI titration    Return in about 1 week (around 10/29/2023) for ICI titration .  Clotilda Cornwall, PA-C   [facility] [address] [place], [place] [address] [phone]

## 2023-10-22 ENCOUNTER — Encounter: Payer: Self-pay | Admitting: Urology

## 2023-10-22 ENCOUNTER — Ambulatory Visit (INDEPENDENT_AMBULATORY_CARE_PROVIDER_SITE_OTHER): Payer: No Typology Code available for payment source | Admitting: Urology

## 2023-10-22 VITALS — BP 176/79 | HR 67 | Wt 189.0 lb

## 2023-10-22 DIAGNOSIS — N5201 Erectile dysfunction due to arterial insufficiency: Secondary | ICD-10-CM

## 2023-10-22 NOTE — Progress Notes (Signed)
   10/27/2023 9:32 AM  Christopher Melton [birth year] [id number]   Referring provider: Lauro Regulus, MD [address] [facility] [place],  [place] [address]  Urological history: 1.  Erectile dysfunction -Contributing factors of age, CKD, CAD, BPH, hyperlipidemia and former smoker -failed PDE5i's  2. BPH  3. Nephrolithiasis  Chief Complaint  Patient presents with   Erectile Dysfunction   HPI: Christopher Melton is a 89 y.o. male who presents today for ICI titration.    Previous records reviewed.     The procedure is discussed with patient.  He is allowed to ask questions.  Questions were answered to his satisfaction.  We were able to proceed to the titration.  Physical Exam:  BP (!) 154/82   Pulse 66   Ht 5\' 10"  (1.778 m)   Wt 189 lb (85.7 kg)   BMI 27.12 kg/m   Constitutional:  Well nourished. Alert and oriented, No acute distress. GU: No CVA tenderness.  No bladder fullness or masses.  Patient with circumcised phallus.  Urethral meatus is patent.  No penile discharge. No penile lesions or rashes.  Psychiatric: Normal mood and affect.   Procedure  Patient's left corpus cavernosum is identified.  An area near the base of the penis is cleansed with rubbing alcohol.  Careful to avoid the dorsal vein, 2 mcg of Trimix (papaverine 30 mg, phentolamine 1 mg and prostaglandin E1 10 mcg, Lot # [id number]$  exp # [id number] is injected at a 90 degree angle into the left  corpus cavernosum near the base of the penis.  Patient experienced a semi firm erection in 15 minutes.    Patient's right corpus cavernosum is identified.  An area near the base of the penis is cleansed with rubbing alcohol.  Careful to avoid the dorsal vein, 2 mcg of Trimix (papaverine 30 mg, phentolamine 1 mg and prostaglandin E1 10 mcg, Lot # [id number]$  exp # [id number] is injected at a 90 degree angle into the right corpus cavernosum near the base of the penis.  Patient experienced a semi firm  erection in 15 minutes.    Patient's left corpus cavernosum is identified.  An area near the base of the penis is cleansed with rubbing alcohol.  Careful to avoid the dorsal vein, 2 mcg of Trimix (papaverine 30 mg, phentolamine 1 mg and prostaglandin E1 10 mcg, Lot # [id number]$  exp # [id number] is injected at a 90 degree angle into the left  corpus cavernosum near the base of the penis.  Patient experienced a semi firm erection in 15 minutes.     Assessment & Plan:    1.  Erectile dysfunction -We did not achieve a satisfactory erection during today's titration, I will have him go ahead and inject 0.6 of the Trimix on Thursday a.m. and on Saturday a.m. and contact me with the response, we may need to move up and potency -Advised patient of the condition of priapism, painful erection lasting for more than four hours, and to contact the office or seek treatment in the ED immediately    Return for Patient to call back with Trimix dose.  Michiel Cowboy, PA-C   [facility] [address] [place], [place] [address] [phone]   I spent 15 minutes on the day of the encounter to include pre-visit record review, face-to-face time with the patient, and post-visit ordering of tests.

## 2023-10-27 ENCOUNTER — Encounter: Payer: Self-pay | Admitting: Urology

## 2023-10-27 ENCOUNTER — Ambulatory Visit: Payer: No Typology Code available for payment source | Admitting: Urology

## 2023-10-27 VITALS — BP 154/82 | HR 66 | Ht 70.0 in | Wt 189.0 lb

## 2023-10-27 DIAGNOSIS — N5201 Erectile dysfunction due to arterial insufficiency: Secondary | ICD-10-CM | POA: Diagnosis not present

## 2023-10-27 NOTE — Patient Instructions (Signed)
TRIMIX SELF-INJECTION INSTRUCTIONS    DETAILED PROCEDURE  1. GETTING SET UP  A. Proper hygiene is important. Wash your hands and keep the penis clean.  B. Assemble the following:  - Bottle of Trimix  - Alcohol pad  - Syringe  C. Keep the Trimix cold by returning the bottle to the refrigerator, or by placing the bottle in a cup of ice.   2. PREPARE THE SYRINGE  A. Wipe the rubber top of the vial with an alcohol pad.  B. After removing the cap of the needle, pull the plunger back to the desired dosage, filling this volume with air. Use a new needle and syringe each time.  C. Insert the needle through the rubber top and inject the air into the vial.  D. Turn the vial with needle and syringe inserted upside down. Pull back on the syringe plunger in a slow and steady motion until the desired dosage is achieved.  Your dosage will be 0.6  E. Tap the side of the syringe (1cc tuberculin syringe with a 29 gauge needle) to allow any air bubbles to float towards the needle. Avoid having these air bubbles in the syringe when self-injecting by first injecting out the collected bubbles that may form.  F. Remove the needle from the bottle and replace the protective cap on the needle.    3. SELECT AND PREPARE THE SITE FOR INJECTION  A. The proper location for injection is at the 9-11 and 1-3 o'clock positions, between the base and mid-portion of the penis.(see diagram) Avoid the midline because of potential for injury to the urethra (6 o'clock; for urinary passage) and the penile arteries and nerves (near 12 o'clock). Avoid any visible veins or arteries on the surface.  B. Grasp and pull the head of the penis toward the side of your leg with the index finger and thumb (use the left hand, if right handed). While maintaining light tension, select a site for injection.  C. Clean the site with an alcohol pad.   4: INJECT TRIMIX AND APPLY COMPRESSION  A. With a steady and continuous motion, penetrate the skin  with the needle at a 90 o angle. The needle should then be advanced to the hub. Slight resistance is encountered as the needle passes into the proper position within the erectile tissue (corporeal body).  B. Inject the Trimix over approximately 4 seconds. Withdraw the needle from the penis and apply compression to the injection site for approximately 1 minute. Several minutes of compression may be required to avoid bleeding, especially if you are an aspirin user.  C. Replace the cap on the needle and dispose of properly.   If you experience a painful erection that will not go down, take four (30 mg) tablets of pseudoephedrine (Sudafed-not the extended release) and if the erection does not go down in the next hour or increases in pain, contact the office immediately or seek treatment in the ED

## 2023-10-28 ENCOUNTER — Ambulatory Visit: Payer: Medicare Other | Admitting: Urology

## 2023-10-28 DIAGNOSIS — R7303 Prediabetes: Secondary | ICD-10-CM | POA: Diagnosis not present

## 2023-10-28 DIAGNOSIS — N183 Chronic kidney disease, stage 3 unspecified: Secondary | ICD-10-CM | POA: Diagnosis not present

## 2023-10-28 DIAGNOSIS — I129 Hypertensive chronic kidney disease with stage 1 through stage 4 chronic kidney disease, or unspecified chronic kidney disease: Secondary | ICD-10-CM | POA: Diagnosis not present

## 2023-11-03 DIAGNOSIS — T466X5A Adverse effect of antihyperlipidemic and antiarteriosclerotic drugs, initial encounter: Secondary | ICD-10-CM | POA: Diagnosis not present

## 2023-11-03 DIAGNOSIS — I779 Disorder of arteries and arterioles, unspecified: Secondary | ICD-10-CM | POA: Diagnosis not present

## 2023-11-03 DIAGNOSIS — R7303 Prediabetes: Secondary | ICD-10-CM | POA: Diagnosis not present

## 2023-11-03 DIAGNOSIS — I129 Hypertensive chronic kidney disease with stage 1 through stage 4 chronic kidney disease, or unspecified chronic kidney disease: Secondary | ICD-10-CM | POA: Diagnosis not present

## 2023-11-03 DIAGNOSIS — N183 Chronic kidney disease, stage 3 unspecified: Secondary | ICD-10-CM | POA: Diagnosis not present

## 2023-11-03 DIAGNOSIS — E7800 Pure hypercholesterolemia, unspecified: Secondary | ICD-10-CM | POA: Diagnosis not present

## 2023-11-03 DIAGNOSIS — G720 Drug-induced myopathy: Secondary | ICD-10-CM | POA: Diagnosis not present

## 2023-11-30 DIAGNOSIS — Z03818 Encounter for observation for suspected exposure to other biological agents ruled out: Secondary | ICD-10-CM | POA: Diagnosis not present

## 2023-11-30 DIAGNOSIS — R6889 Other general symptoms and signs: Secondary | ICD-10-CM | POA: Diagnosis not present

## 2023-12-06 DIAGNOSIS — B338 Other specified viral diseases: Secondary | ICD-10-CM | POA: Diagnosis not present

## 2024-01-12 DIAGNOSIS — H353132 Nonexudative age-related macular degeneration, bilateral, intermediate dry stage: Secondary | ICD-10-CM | POA: Diagnosis not present

## 2024-01-12 DIAGNOSIS — H35373 Puckering of macula, bilateral: Secondary | ICD-10-CM | POA: Diagnosis not present

## 2024-01-12 DIAGNOSIS — Z961 Presence of intraocular lens: Secondary | ICD-10-CM | POA: Diagnosis not present

## 2024-01-12 DIAGNOSIS — H43813 Vitreous degeneration, bilateral: Secondary | ICD-10-CM | POA: Diagnosis not present

## 2024-04-21 DIAGNOSIS — N183 Chronic kidney disease, stage 3 unspecified: Secondary | ICD-10-CM | POA: Diagnosis not present

## 2024-04-21 DIAGNOSIS — I129 Hypertensive chronic kidney disease with stage 1 through stage 4 chronic kidney disease, or unspecified chronic kidney disease: Secondary | ICD-10-CM | POA: Diagnosis not present

## 2024-04-21 DIAGNOSIS — R7303 Prediabetes: Secondary | ICD-10-CM | POA: Diagnosis not present

## 2024-04-21 DIAGNOSIS — E7800 Pure hypercholesterolemia, unspecified: Secondary | ICD-10-CM | POA: Diagnosis not present

## 2024-05-06 DIAGNOSIS — T466X5A Adverse effect of antihyperlipidemic and antiarteriosclerotic drugs, initial encounter: Secondary | ICD-10-CM | POA: Diagnosis not present

## 2024-05-06 DIAGNOSIS — E7800 Pure hypercholesterolemia, unspecified: Secondary | ICD-10-CM | POA: Diagnosis not present

## 2024-05-06 DIAGNOSIS — I129 Hypertensive chronic kidney disease with stage 1 through stage 4 chronic kidney disease, or unspecified chronic kidney disease: Secondary | ICD-10-CM | POA: Diagnosis not present

## 2024-05-06 DIAGNOSIS — Z0000 Encounter for general adult medical examination without abnormal findings: Secondary | ICD-10-CM | POA: Diagnosis not present

## 2024-05-06 DIAGNOSIS — N183 Chronic kidney disease, stage 3 unspecified: Secondary | ICD-10-CM | POA: Diagnosis not present

## 2024-05-06 DIAGNOSIS — G720 Drug-induced myopathy: Secondary | ICD-10-CM | POA: Diagnosis not present

## 2024-05-06 DIAGNOSIS — Z1331 Encounter for screening for depression: Secondary | ICD-10-CM | POA: Diagnosis not present

## 2024-05-06 DIAGNOSIS — R7303 Prediabetes: Secondary | ICD-10-CM | POA: Diagnosis not present

## 2024-05-06 DIAGNOSIS — I779 Disorder of arteries and arterioles, unspecified: Secondary | ICD-10-CM | POA: Diagnosis not present
